# Patient Record
Sex: Male | Born: 1985
Health system: Southern US, Community
[De-identification: ages and names within clinical notes are randomized; demographics above are authoritative.]

## PROBLEM LIST (undated history)

## (undated) DIAGNOSIS — I255 Ischemic cardiomyopathy: Secondary | ICD-10-CM

## (undated) DIAGNOSIS — I2510 Atherosclerotic heart disease of native coronary artery without angina pectoris: Secondary | ICD-10-CM

## (undated) DIAGNOSIS — L732 Hidradenitis suppurativa: Secondary | ICD-10-CM

## (undated) DIAGNOSIS — E785 Hyperlipidemia, unspecified: Secondary | ICD-10-CM

## (undated) DIAGNOSIS — Z9289 Personal history of other medical treatment: Secondary | ICD-10-CM

## (undated) HISTORY — PX: ANKLE SURGERY: SHX546

## (undated) HISTORY — PX: PILONIDAL CYST EXCISION: SHX744

## (undated) HISTORY — PX: CORONARY ANGIOPLASTY: SHX604

## (undated) HISTORY — DX: Hyperlipidemia, unspecified: E78.5

## (undated) HISTORY — DX: Hidradenitis suppurativa: L73.2

## (undated) HISTORY — PX: CARDIAC CATHETERIZATION: SHX172

## (undated) HISTORY — PX: WISDOM TOOTH EXTRACTION: SHX21

## (undated) HISTORY — DX: Ischemic cardiomyopathy: I25.5

## (undated) HISTORY — DX: Atherosclerotic heart disease of native coronary artery without angina pectoris: I25.10

## (undated) HISTORY — DX: Personal history of other medical treatment: Z92.89

---

## 2004-01-26 ENCOUNTER — Emergency Department (HOSPITAL_COMMUNITY): Admission: EM | Admit: 2004-01-26 | Discharge: 2004-01-26 | Payer: Self-pay | Admitting: Emergency Medicine

## 2004-01-28 ENCOUNTER — Inpatient Hospital Stay (HOSPITAL_COMMUNITY): Admission: AD | Admit: 2004-01-28 | Discharge: 2004-02-02 | Payer: Self-pay | Admitting: Specialist

## 2005-12-20 ENCOUNTER — Ambulatory Visit (HOSPITAL_BASED_OUTPATIENT_CLINIC_OR_DEPARTMENT_OTHER): Admission: RE | Admit: 2005-12-20 | Discharge: 2005-12-20 | Payer: Self-pay | Admitting: Surgery

## 2005-12-20 ENCOUNTER — Encounter (INDEPENDENT_AMBULATORY_CARE_PROVIDER_SITE_OTHER): Payer: Self-pay | Admitting: *Deleted

## 2006-02-06 ENCOUNTER — Ambulatory Visit: Payer: Self-pay | Admitting: Family Medicine

## 2006-02-07 ENCOUNTER — Ambulatory Visit: Payer: Self-pay | Admitting: Family Medicine

## 2007-10-09 ENCOUNTER — Encounter (INDEPENDENT_AMBULATORY_CARE_PROVIDER_SITE_OTHER): Payer: Self-pay | Admitting: Surgery

## 2007-10-09 ENCOUNTER — Ambulatory Visit (HOSPITAL_COMMUNITY): Admission: RE | Admit: 2007-10-09 | Discharge: 2007-10-09 | Payer: Self-pay | Admitting: Surgery

## 2008-01-13 ENCOUNTER — Encounter (INDEPENDENT_AMBULATORY_CARE_PROVIDER_SITE_OTHER): Payer: Self-pay | Admitting: Surgery

## 2008-01-13 ENCOUNTER — Ambulatory Visit (HOSPITAL_BASED_OUTPATIENT_CLINIC_OR_DEPARTMENT_OTHER): Admission: RE | Admit: 2008-01-13 | Discharge: 2008-01-13 | Payer: Self-pay | Admitting: Surgery

## 2010-10-03 NOTE — Op Note (Signed)
[name], [name]              ACCOUNT NO.:  [id number]   MEDICAL RECORD NO.:  [id number]          PATIENT TYPE:  AMB   LOCATION:  DSC                          FACILITY:  [facility]   PHYSICIAN:  Wilmon Arms. Corliss Skains, M.D. DATE OF BIRTH:  [birth year]   DATE OF PROCEDURE:  01/13/2008  DATE OF DISCHARGE:                               OPERATIVE REPORT   PREOPERATIVE DIAGNOSIS:  Chronic pilonidal abscess.   POSTOPERATIVE DIAGNOSIS:  Chronic pilonidal abscess.   PROCEDURE PERFORMED:  Pilonidal cystectomy.   SURGEON:  Wilmon Arms. Corliss Skains, MD   ANESTHESIA:  General.   INDICATIONS:  The patient is a 25 year old male who has had chronic  problems with a pilonidal abscess.  He has had 2 previous excisions.  Both these healed up well except for the most inferior portion of the  wound.  This has began draining.  It seems to be tunneling towards a  midline pit closer to the anus.  However, this does not seem to  represent a perirectal abscess.  Instead, this seems to be a tunneling  pilonidal abscess.  We returned to the operating room today to try to  excise all this area.   DESCRIPTION OF PROCEDURE:  The patient was brought to the operating room  and placed in supine position on the operating table.  After an adequate  level of general anesthesia was obtained, the patient was flipped to a  prone position.  The area around the wound was shaved, prepped with  Betadine and draped in sterile fashion.  A time-out was taken to ensure  the proper patient and proper procedure.  I infiltrated the area around  the abscess with 0.25% Marcaine with epinephrine.  I injected local  anesthetic into the draining sinus tract and this local was seen to exit  one of the midline pits.  I was able to pass a probe to this area.  I  then took a #10 scalpel and excised the skin and granulation tissue  around the sinus tract as well as the midline pits.  This created an  elliptical wound about 2-1/2 inches long.  Cautery  was then used to  excise all of the chronic granulation tissue back to healthy appearing  fat.  This continued down all the way to the sacral fascia.  Cautery was  used for hemostasis.  We then thoroughly irrigated.  No further  granulation tissue was noted.  The wound was packed with 4x4 gauze.  An  ABD was applied.  The patient was then extubated and brought to recovery  room in stable condition.  All sponge, instrument, and needle counts  were correct.      Wilmon Arms. Tsuei, M.D.  Electronically Signed     MKT/MEDQ  D:  01/13/2008  T:  01/13/2008  Job:  [id number]

## 2010-10-03 NOTE — Op Note (Signed)
[name], [name]              ACCOUNT NO.:  [id number]   MEDICAL RECORD NO.:  [id number]          PATIENT TYPE:  AMB   LOCATION:  DAY                          FACILITY:  [facility]   PHYSICIAN:  Wilmon Arms. Corliss Skains, M.D. DATE OF BIRTH:  1985/11/28   DATE OF PROCEDURE:  10/09/2007  DATE OF DISCHARGE:                               OPERATIVE REPORT   PREOPERATIVE DIAGNOSIS:  Chronic pilonidal abscess.   POSTOPERATIVE DIAGNOSIS:  Chronic pilonidal abscess.   PROCEDURE PERFORMED:  Excision of chronic pilonidal abscess.   SURGEON:  Tsuei   ANESTHESIA:  General.   INDICATIONS:  The patient is a 25 year old male who has previously had a  pilonidal abscess excised.  The patient has subsequently developed a  chronic wound which occasionally becomes inflamed as well as opens and  spontaneously drains.  This has been kept under control with  antibiotics, but the patient now presents for excision of this entire  area.  Our preoperative plan is to leave him with an open wound to heal  by secondary intention.  The patient understands this plan.   DESCRIPTION OF PROCEDURE:  The patient is brought to the operating room,  placed in a supine position on the operating room stretcher.  After an  adequate level of general anesthesia was obtained, the patient was  flipped to a prone position on the bed with appropriate padding.  The  area around the lower back and upper buttocks was prepped with Betadine  and draped in sterile fashion.  We infiltrated the area around the  chronic wound with 0.25% Marcaine with epinephrine.  There was a little  bit of purulence seen coming from the wound.  I probed the drainage  tract with hemostat.  This seemed to track medially and inferiorly.  We  excised all the old scar tissue.  We took our excision around all of the  chronic inflammation down to the fascia.  There was an area of  granulation tissue seen tracking medially and inferiorly.  This was also  excised.   All of the surrounding tissue seemed to be healthy and clean  with no sign of chronic granulation or infection.  The wound was then  thoroughly irrigated.  The wound was packed with a 4 inch x 4 inch piece  of gauze soaked in saline.  A dry dressing was applied.  The patient was  then extubated and brought to recovery in stable condition.  All sponge,  instrument, and needle counts were correct.      Wilmon Arms. Tsuei, M.D.  Electronically Signed     MKT/MEDQ  D:  10/09/2007  T:  10/09/2007  Job:  [id number]

## 2010-10-06 NOTE — Op Note (Signed)
NAMEERIE, RADU              ACCOUNT NO.:  [id number]   MEDICAL RECORD NO.:  [id number]           PATIENT TYPE:   LOCATION:                               FACILITY:  [facility]   PHYSICIAN:  Wilmon Arms. Tsuei, M.D.      DATE OF BIRTH:   DATE OF PROCEDURE:  12/20/2005  DATE OF DISCHARGE:                               OPERATIVE REPORT   PREOPERATIVE DIAGNOSIS:  Chronic pilonidal cyst.   POSTOPERATIVE DIAGNOSIS:  Chronic pilonidal cyst.   PROCEDURE PERFORMED:  Pilonidal cystectomy.   SURGEON:  Wilmon Arms. Tsuei, M.D.   ANESTHESIA:  General.   INDICATIONS:  The patient is a 25 year old male who has had a 6- to 7-  year history of an inflamed pilonidal cyst.  This occasionally becomes  inflamed and infected, but has never drained.  On examination, he has a  2-cm palpable subcutaneous mass at the area of the coccyx.  There are  several midline pits and some scarring.   DESCRIPTION OF PROCEDURE:  The patient was brought to the operating room  and placed in the supine position on his stretcher.  After an adequate  level of general anesthesia was obtained, the patient was flipped to a  prone position on the operating room table.  Appropriate padding was  placed.  The area around the pilonidal cyst was shaved, prepped with  Betadine and draped in sterile fashion.  A time-out was taken to assure  the proper patient and proper procedure.  A vertical incision was made  just left of the midline over the palpable mass.  Dissection was carried  down through the dermis into the subcutaneous tissues.  Cautery was used  to excise a wide area of scar tissue down to normal appearing fat.  The  granulation tissue seemed to track inferiorly towards the midline pits.  We continued dissecting this free with cautery.  Once all of the  granulation tissue was removed, the wound was then thoroughly irrigated.  It was closed with a deep layer of 3-0 Vicryl and 2-0 nylon sutures in  the skin.  The patient was  then extubated and brought to the recovery  room in stable condition.  All sponge, instrument and needle counts were  correct.      Wilmon Arms. Tsuei, M.D.  Electronically Signed     MKT/MEDQ  D:  05/20/2006  T:  05/21/2006  Job:  [id number]

## 2010-10-06 NOTE — Discharge Summary (Signed)
[name], [name]              ACCOUNT NO.:  [id number]   MEDICAL RECORD NO.:  [id number]          PATIENT TYPE:  INP   LOCATION:  5006                         FACILITY:  [facility]   PHYSICIAN:  Kerrin Champagne, M.D.   DATE OF BIRTH:  04/12/1986   DATE OF ADMISSION:  01/28/2004  DATE OF DISCHARGE:  02/02/2004                                 DISCHARGE SUMMARY   ADMISSION DIAGNOSES:  1.  Left displaced comminuted Taylor neck fracture.  2.  Right ankle pain, questionable fracture.  3.  Attention deficit disorder.   DISCHARGE DIAGNOSES:  1.  Left closed Taylor neck fracture, Hawkins type II.  2.  Fracture of posterior malleolus, nondisplaced, right ankle.  3.  Attention deficit disorder.   PROCEDURE:  On 01/29/2004, the patient underwent open reduction/internal  fixation of left closed Taylor neck fracture as well as stress x-rays of the  left ankle performed under general anesthesia by Dr. Leonides Grills and Dr.  Vira Browns.   CONSULTATIONS:  Dr. Lestine Box.   BRIEF HISTORY:  The patient is a 25 year old male who fell approximately two  days prior to admission from a retaining wall as it gave way from  approximately 20 feet.  He was evaluated in the emergency room at [facility] on that day and noted to have an isolated left closed Taylor neck  fracture which was treated with a splint placement.  He was seen by Dr.  Otelia Sergeant in his office, at which time, the patient was noted to have  displacement and severe comminution of the left Taylor neck fracture which  would require surgical intervention.  From the office the patient was  admitted to the hospital to undergo CT scan followed by open  reduction/internal fixation of the fracture.   BRIEF HOSPITAL COURSE:  Upon admission, the patient did undergo the CT scan  with reformatting.  He was noted to have severe comminution of the left  Taylor neck fracture.  He underwent the procedure as stated above without  complications.  He  continued to have pain of the right ankle and foot area.  Due to this continued pain, his activity level was held until a CT scan was  performed of the right foot.  The right foot CT scan did show a posterior  malleolus fracture on the right.  At that time, he was placed into a CAM  walker for the right ankle.  As for activity, he was allowed bed-to-chair  transfers only.  This was done by allowing non-weightbearing on the  operative left lower extremity and touch down weightbearing only on the  right lower extremity while utilizing a CAM walker.  Therefore a wheelchair  had to be available to him at home and this was arranged prior to his  transfer to home.  The patient was placed on aspirin for DVT prophylaxis.  He was instructed in ice and elevation of his foot and did so during the  hospital stay.  Postoperatively, he did require some IV narcotic analgesics,  but eventually was weaned to oral analgesics without difficulty.  He had  good  capillary refill and the sensation of his toes.  His cast to the left  lower extremity did fit well.  The patient was afebrile and vital signs were  stable.  On 02/02/2004, after he had proven to be independent with  transfers, he was discharged to his home.   PERTINENT LABORATORY VALUES:  On admission, CBC was within normal limits.  Chemistry studies on admission showed sodium 132, potassium 3.2, calcium  8.3.   PLAN:  The patient was discharged to his home.  Dr. Lestine Box did provide  instructions for the patient as he will be taking over the patient's care.  The patient will follow up with Dr. Lestine Box in his office in approximately  one week and has been given a number to call to arrange the appointment.  He  is instructed to elevate his left foot above his heart and have active range  of motion of his toes at all times on both feet.  He is instructed to keep  his splint to the left lower extremity dry and clean at all times.  For  transfers, he will  be allowed non-weightbearing on the left lower extremity  and touch down weightbearing on the right lower extremity while wearing a  sprain walker.  He will not be allowed ambulatory function at this time.  Prescriptions at discharge include Mepergan, Fortis, and  Robaxin.  He will  continue on a regular diet.   CONDITION ON DISCHARGE:  Stable.   All questions were encouraged and answered by Dr. Lestine Box.       SMV/MEDQ  D:  03/16/2004  T:  03/17/2004  Job:  [id number]

## 2010-10-06 NOTE — Assessment & Plan Note (Signed)
[facility]                          [place] OFFICE NOTE   NAME:[name], [name] SEDIVY                     MRN:          [id number]  DATE:02/06/2006                            DOB:          1986/03/19    REASON FOR VISIT:  [name] [name] is a 25 year old male who reports that for  several years he has noted that he had a lymph node on the right side of his  neck which has not grown in size.  Over the last several months, he has  noticed similar lymph nodes on the left side of the neck, left clavicle and  inguinal area.  He denies any fevers, chills, weight loss, weight gain.  Acne by his dermatologist.  He has been on multiple medications including  oral.  No family history of leukemia or lymphoma.   The patient does have a history of skin eruption that according to the  patient has been treated as acne.  He has been on multiple oral agents and  tells me that his next option is Accutane.  The eruptions occur on his  chest, back and groin.  He reports that they are mildly pruritic.  He has  also noticed other eruptions on his arms briefly which initially started as  pruritis.  The patient is currently followed by dermatologist but has not  seen an allergist.   PAST MEDICAL HISTORY:  Acne.   MEDICATIONS:  None.   ALLERGIES:  PENICILLIN.   PAST SURGICAL HISTORY:  1. Left ankle surgery for internal fixation.  2. Pilonidal cystectomy.   FAMILY HISTORY:  Father and mother alive and well.  He has one brother who  is also alive and well.   SOCIAL HISTORY:  The patient is a Consulting civil engineer at [employer].  He is single.  Smokes  socially as well as drinks alcohol socially.   REVIEW OF SYSTEMS:  As per HPI.  Otherwise unremarkable.   OBJECTIVE:  VITAL SIGNS:  Weight 155.4, temperature 98.4, pulse 60, blood  pressure 100/66.  GENERAL:  Pleasant male in no acute distress.  Answers questions  appropriately.  HEENT:  Unremarkable.  NECK:  Supple.  There are two  shotty lymph nodes, one in the right posterior  cervical area as well as a smaller one on the left posterior cervical area.  They are no bigger than 6-7 mm in diameter.  There was a smaller 3-4 mm,  easily movable lymph node at the left supraclavicular area, i.e., midline.  No axillary lymph nodes were appreciated.  ABDOMEN:  Soft, nondistended.  No palpable masses.  No hepatosplenomegaly.  Inguinal area examination significant for bilateral shotty lymph nodes in a  linear pattern, nontender.  SKIN:  Significant for diffuse follicular erythematous eruptions on the  chest, less on the abdomen but increased in the pubic area.  The upper  extremities significant for sporadic follicular eruptions.  No pustules  noted.   IMPRESSION:  A 25 year old male presenting with prominence of lymph nodes  which may be reactive lymph nodes secondary to chronic inflammatory skin  condition given the location of the eruptions.  Nonetheless,  further  evaluation is warranted.   PLAN:  1. Will obtain a CBC with differential to rule out a significantly      elevated white count.  2. Will refer the patient to an allergist given the chronic skin eruptions      to rule out allergic etiology.  3. Further recommendations after review of the CBC.                                   Leanne Chang, M.D.   LA/MedQ  DD:  02/07/2006  DT:  02/08/2006  Job #:  [id number]

## 2010-10-06 NOTE — Op Note (Signed)
NAMEELIER, ZELLARS              ACCOUNT NO.:  [id number]   MEDICAL RECORD NO.:  [id number]          PATIENT TYPE:  AMB   LOCATION:  DSC                          FACILITY:  [facility]   PHYSICIAN:  Wilmon Arms. Corliss Skains, M.D. DATE OF BIRTH:  1985-09-02   DATE OF PROCEDURE:  12/20/2005  DATE OF DISCHARGE:                                 OPERATIVE REPORT   PREOPERATIVE DIAGNOSIS:  Pilonidal cyst.   POSTOPERATIVE DIAGNOSIS:  Pilonidal cyst.   PROCEDURE PERFORMED:  Pilonidal cystectomy.   SURGEON:  Wilmon Arms. Tsuei, M.D.   ANESTHESIA:  General endotracheal.   INDICATIONS:  The patient is a 25 year old male who presents with a 6- to 7-  year history of intermittent problems with a pilonidal cyst.  He has had  several instances where it has become fairly large and inflamed.  It has  never drained any purulent material.  He was evaluated this summer and was  noted to have a large noninflamed cyst.  The cyst did appear to be more  superior in location than the more common pilonidal cyst.  He presents today  for elective excision.   DESCRIPTION OF PROCEDURE:  The patient was brought to the operating room on  a stretcher.  After an adequate level of general endotracheal anesthesia was  obtained, the patient was turned to a prone position on chest rolls with  appropriate padding.  His gluteal cleft and lower back was shaved, prepped  with Betadine and draped in sterile fashion.  A time-out was taken to assure  the proper patient and proper procedure.  The cyst was easily palpable to  the left of midline approximately 2-3 cm above the coccyx.  A vertical  incision was made over this cyst after infiltrating with 0.25% Marcaine.  We  dissected down to a cystic-like structure that was filled with a slimy  material.  Cautery was used to dissect the entire cyst wall away from the  surrounding tissues.  The surrounding tissues were normal and noninflamed in  appearance.  A small probe was used to  explore for any tracks.  The patient  was noted to have some small midline pits, but none of these appeared to be  patent.  They probed the track inferiorly towards the midline pits, but  there was no external skin opening identified.  Saline was squirted into  this track under pressure, and no external drainage was noted.  The wall of  the small tract was thoroughly cauterized and ablated.  The remaining cavity  was then thoroughly irrigated, and hemostasis was obtained with cautery.  The wound was closed with a deep layer of 3-0 Vicryl, tacking the  subcutaneous tissue down to the posterior surface of the muscle.  A 4-0  Monocryl was used to close the skin in subcuticular fashion.  Steri-Strips  and clean dressings were applied.  The patient was then extubated and  brought to recovery in stable condition.  All sponge, instrument and needle  counts correct.     Wilmon Arms. Tsuei, M.D.  Electronically Signed    MKT/MEDQ  D:  12/20/2005  T:  12/20/2005  Job:  [id number]

## 2010-10-06 NOTE — Op Note (Signed)
NAME:  [name], [name]                        ACCOUNT NO.:  [id number]   MEDICAL RECORD NO.:  [id number]                   PATIENT TYPE:  INP   LOCATION:  5006                                 FACILITY:  [facility]   PHYSICIAN:  Leonides Grills, M.D.                  DATE OF BIRTH:  07-19-1985   DATE OF PROCEDURE:  01/29/2004  DATE OF DISCHARGE:                                 OPERATIVE REPORT   PREOPERATIVE DIAGNOSIS:  Left closed talar neck fracture, Hawkins type II.   POSTOPERATIVE DIAGNOSIS:  Left closed talar neck fracture, Hawkins type II.   OPERATION:  1.  Open reduction internal fixation left closed talar neck fracture.  2.  Stress x-rays left ankle.   ANESTHESIA:  General endotracheal tube.   SURGEON:  Leonides Grills, M.D.   ASSISTANT:  Vira Browns, M.D.   ESTIMATED BLOOD LOSS:  Minimal.   TOURNIQUET TIME:  1 hour and 40 minutes.   COMPLICATIONS:  None.   DISPOSITION:  Stable to the PAR.   INDICATION:  This is a 25 year old male who approximately 2 days ago fell  off a retaining wall after it gave way and fell approximately 20 feet.  He  had an isolated left closed talar neck fracture that was initially evaluated  in the ER.  He was then sent home in a splint.  Due to his grandmother  knowing Dr. Otelia Sergeant, he then reported to our acute care clinic on Friday  afternoon, it was where Dr. Otelia Sergeant diagnosed him with a talar neck fracture  and worked him up appropriately and admitted him to the hospital for further  evaluation and treatment.  After he admitted to hospital, a CT scan was  obtained and showed a talar neck fracture with an extension into the body  with a free lateral process component.  After discussing the case with  myself, we then planned to do open reduction internal fixation the very next  morning, especially since his skin was of excellent quality and there was  wrinkles in his skin from keeping him elevated for at least 24 hours.  He  was consented for the  above procedure, all of which included infection,  nerve vessel injury, nonunion, malunion, __________, heart failure,  stiffness, arthritis, avascular necrosis and collapse with arthritis in both  the ankle and subtalar joint and possible future surgery were all explained,  questions were encouraged and answered.   OPERATION:  The patient was brought to the operating room, placed in the  supine position.  After adequate general endotracheal tube anesthesia was  administered as well as Ancef 1 gm IVPB, a bump was placed under the left  ipsilateral hip, internally rotating his knee straight up, and the left  lower extremity was then prepped and draped in a sterile manner over a  proximally placed thigh tourniquet.  Limb was gravity exsanguinated and  tourniquet was elevated to 290 mmHg.  A longitudinal incision just dorsal to  the posterior tibial tendon was then made, dissection was carried down  through skin, hemostasis was obtained.  A large soft tissue envelope was  obtained and the saphenous vein was identified and protected throughout the  case.  Once this was elevated and the fracture site was encountered, the  hematoma and residual comminuted fragments were removed, the piece was then  anatomically positioned and provisionally fixed with a 1.25 K-wire.  We then  made a longitudinal incision over the dorsal lateral aspect of the talus,  dissection was carried down through skin, hemostasis was obtained.  The  extensor digitorum brevis was then released from its origin and the sinus  tarsi was then debrided of hematoma and mild comminution.  This was the  larger of the two fragments.  The plantar soft tissue component to the  fragment was preserved.  The piece was then internally rotated, exposing the  coronal fracture line, and the hematoma as well as comminuted fragments were  removed not only from the fracture site but also from the subtalar joint,  ankle joint area.  The piece was  then rotated back anatomically, positioned  anatomically, compressed with a two-point Weber reduction clamp from both  medial and laterally and then provisionally affixed with the 1.25 K-wire.  This was then verified under C-arm guidance in the AP, lateral and Canale  view.  We then placed 2.7 mm fully threaded cortical lag screws, three  lateral and one medial, that held the fracture anatomically.  K-wires were  then removed.  Stress x-rays were obtained and showed that there was no  gross motion and the pieces were in the anatomic position as well as  fixation was in the proper position as well.  The joints and wounds were  copiously irrigated with normal saline.  Tourniquet was inflated, hemostasis  was obtained.  __________ we obtained a Canale view as well and showed that  the neck was anatomically reduced.  We also obtained AP and mortise views of  the ankle which showed it was in excellent position as well.  Once the ankle  was copiously irrigated with normal saline solution, extensor digitorum  brevis and a portion of the lateral capsule and ligaments were repaired with  2-0 Vicryl suture.  The subcu was closed with a 3-0 Vicryl suture over all  wounds and skin was closed with 4-0 nylon overall wounds.  A sterile  dressing was applied.  Modified Jones dressing was applied.  The patient was  stable to the PAR.                                               Leonides Grills, M.D.    PB/MEDQ  D:  01/29/2004  T:  01/30/2004  Job:  [id number]

## 2013-06-14 ENCOUNTER — Encounter (HOSPITAL_BASED_OUTPATIENT_CLINIC_OR_DEPARTMENT_OTHER): Payer: Self-pay | Admitting: Emergency Medicine

## 2013-06-14 ENCOUNTER — Ambulatory Visit (HOSPITAL_COMMUNITY): Admit: 2013-06-14 | Payer: Self-pay | Admitting: Cardiovascular Disease

## 2013-06-14 ENCOUNTER — Encounter (HOSPITAL_COMMUNITY)
Admission: EM | Disposition: A | Discharge status: Home / Self Care | Payer: BC Managed Care – PPO | Source: Home / Self Care | Attending: Cardiology

## 2013-06-14 ENCOUNTER — Emergency Department (HOSPITAL_BASED_OUTPATIENT_CLINIC_OR_DEPARTMENT_OTHER): Payer: BC Managed Care – PPO

## 2013-06-14 ENCOUNTER — Inpatient Hospital Stay (HOSPITAL_BASED_OUTPATIENT_CLINIC_OR_DEPARTMENT_OTHER)
Admission: EM | Admit: 2013-06-14 | Discharge: 2013-06-17 | DRG: 247 | Disposition: A | Discharge status: Home / Self Care | Payer: BC Managed Care – PPO | Attending: Cardiology | Admitting: Cardiology

## 2013-06-14 ENCOUNTER — Other Ambulatory Visit: Payer: Self-pay

## 2013-06-14 DIAGNOSIS — I2102 ST elevation (STEMI) myocardial infarction involving left anterior descending coronary artery: Secondary | ICD-10-CM

## 2013-06-14 DIAGNOSIS — R778 Other specified abnormalities of plasma proteins: Secondary | ICD-10-CM

## 2013-06-14 DIAGNOSIS — R079 Chest pain, unspecified: Secondary | ICD-10-CM

## 2013-06-14 DIAGNOSIS — Z880 Allergy status to penicillin: Secondary | ICD-10-CM

## 2013-06-14 DIAGNOSIS — I2510 Atherosclerotic heart disease of native coronary artery without angina pectoris: Secondary | ICD-10-CM

## 2013-06-14 DIAGNOSIS — I2109 ST elevation (STEMI) myocardial infarction involving other coronary artery of anterior wall: Principal | ICD-10-CM | POA: Diagnosis present

## 2013-06-14 DIAGNOSIS — R799 Abnormal finding of blood chemistry, unspecified: Secondary | ICD-10-CM

## 2013-06-14 DIAGNOSIS — I498 Other specified cardiac arrhythmias: Secondary | ICD-10-CM | POA: Diagnosis present

## 2013-06-14 DIAGNOSIS — I214 Non-ST elevation (NSTEMI) myocardial infarction: Secondary | ICD-10-CM | POA: Diagnosis present

## 2013-06-14 DIAGNOSIS — T43625A Adverse effect of amphetamines, initial encounter: Secondary | ICD-10-CM

## 2013-06-14 DIAGNOSIS — Z872 Personal history of diseases of the skin and subcutaneous tissue: Secondary | ICD-10-CM

## 2013-06-14 DIAGNOSIS — R7989 Other specified abnormal findings of blood chemistry: Secondary | ICD-10-CM

## 2013-06-14 DIAGNOSIS — D72829 Elevated white blood cell count, unspecified: Secondary | ICD-10-CM | POA: Diagnosis present

## 2013-06-14 DIAGNOSIS — I519 Heart disease, unspecified: Secondary | ICD-10-CM

## 2013-06-14 HISTORY — PX: PERCUTANEOUS CORONARY STENT INTERVENTION (PCI-S): SHX5485

## 2013-06-14 HISTORY — PX: LEFT HEART CATHETERIZATION WITH CORONARY ANGIOGRAM: SHX5451

## 2013-06-14 LAB — MRSA PCR SCREENING: MRSA BY PCR: NEGATIVE

## 2013-06-14 LAB — CBC
HEMATOCRIT: 39.8 % (ref 39.0–52.0)
Hemoglobin: 13.9 g/dL (ref 13.0–17.0)
MCH: 29.8 pg (ref 26.0–34.0)
MCHC: 34.9 g/dL (ref 30.0–36.0)
MCV: 85.4 fL (ref 78.0–100.0)
Platelets: 277 10*3/uL (ref 150–400)
RBC: 4.66 MIL/uL (ref 4.22–5.81)
RDW: 13.7 % (ref 11.5–15.5)
WBC: 16.3 10*3/uL — ABNORMAL HIGH (ref 4.0–10.5)

## 2013-06-14 LAB — CBC WITH DIFFERENTIAL/PLATELET
BASOS ABS: 0 10*3/uL (ref 0.0–0.1)
BASOS PCT: 0 % (ref 0–1)
EOS ABS: 0.1 10*3/uL (ref 0.0–0.7)
EOS PCT: 1 % (ref 0–5)
HEMATOCRIT: 44.9 % (ref 39.0–52.0)
Hemoglobin: 15.6 g/dL (ref 13.0–17.0)
LYMPHS PCT: 14 % (ref 12–46)
Lymphs Abs: 2.7 10*3/uL (ref 0.7–4.0)
MCH: 29.4 pg (ref 26.0–34.0)
MCHC: 34.7 g/dL (ref 30.0–36.0)
MCV: 84.7 fL (ref 78.0–100.0)
MONO ABS: 1.5 10*3/uL — AB (ref 0.1–1.0)
MONOS PCT: 8 % (ref 3–12)
NEUTROS ABS: 15.6 10*3/uL — AB (ref 1.7–7.7)
NEUTROS PCT: 78 % — AB (ref 43–77)
Platelets: 274 10*3/uL (ref 150–400)
RBC: 5.3 MIL/uL (ref 4.22–5.81)
RDW: 13.2 % (ref 11.5–15.5)
WBC: 19.9 10*3/uL — ABNORMAL HIGH (ref 4.0–10.5)

## 2013-06-14 LAB — COMPREHENSIVE METABOLIC PANEL
ALT: 43 U/L (ref 0–53)
AST: 46 U/L — AB (ref 0–37)
Albumin: 5.2 g/dL (ref 3.5–5.2)
Alkaline Phosphatase: 61 U/L (ref 39–117)
BUN: 12 mg/dL (ref 6–23)
CO2: 24 meq/L (ref 19–32)
CREATININE: 1 mg/dL (ref 0.50–1.35)
Calcium: 10.1 mg/dL (ref 8.4–10.5)
Chloride: 95 mEq/L — ABNORMAL LOW (ref 96–112)
GFR calc Af Amer: >90 mL/min (ref >90)
GFR calc non Af Amer: >90 mL/min (ref >90)
Glucose, Bld: 121 mg/dL — ABNORMAL HIGH (ref 70–99)
POTASSIUM: 3.6 meq/L — AB (ref 3.7–5.3)
Sodium: 138 mEq/L (ref 137–147)
Total Bilirubin: 0.7 mg/dL (ref 0.3–1.2)
Total Protein: 8.2 g/dL (ref 6.0–8.3)

## 2013-06-14 LAB — TROPONIN I
Troponin I: 0.35 ng/mL (ref <0.30)
Troponin I: 3.06 ng/mL (ref <0.30)
Troponin I: >20 ng/mL (ref <0.30)

## 2013-06-14 LAB — RAPID URINE DRUG SCREEN, HOSP PERFORMED
Amphetamines: POSITIVE — AB
BARBITURATES: NOT DETECTED
BENZODIAZEPINES: NOT DETECTED
Cocaine: NOT DETECTED
Opiates: POSITIVE — AB
Tetrahydrocannabinol: NOT DETECTED

## 2013-06-14 LAB — LACTIC ACID, PLASMA: Lactic Acid, Venous: 1.1 mmol/L (ref 0.5–2.2)

## 2013-06-14 LAB — D-DIMER, QUANTITATIVE: D-Dimer, Quant: <0.27 ug/mL-FEU (ref 0.00–0.48)

## 2013-06-14 LAB — PRO B NATRIURETIC PEPTIDE: Pro B Natriuretic peptide (BNP): 66 pg/mL (ref 0–125)

## 2013-06-14 SURGERY — LEFT HEART CATHETERIZATION WITH CORONARY ANGIOGRAM
Anesthesia: LOCAL

## 2013-06-14 MED ORDER — HEPARIN BOLUS VIA INFUSION
4000.0000 [IU] | Freq: Once | INTRAVENOUS | Status: AC
  Administered 2013-06-14: 4000 [IU] via INTRAVENOUS
  Filled 2013-06-14: qty 4000

## 2013-06-14 MED ORDER — LIDOCAINE HCL (PF) 1 % IJ SOLN
INTRAMUSCULAR | Status: AC
  Filled 2013-06-14: qty 30

## 2013-06-14 MED ORDER — NITROGLYCERIN IN D5W 200-5 MCG/ML-% IV SOLN
INTRAVENOUS | Status: AC
  Administered 2013-06-14: 5 ug/min via INTRAVENOUS
  Filled 2013-06-14: qty 250

## 2013-06-14 MED ORDER — TIROFIBAN HCL IV 5 MG/100ML
0.1500 ug/kg/min | INTRAVENOUS | Status: AC
Start: 2013-06-14 — End: 2013-06-15
  Administered 2013-06-14 (×2): 0.15 ug/kg/min via INTRAVENOUS
  Filled 2013-06-14 (×4): qty 100

## 2013-06-14 MED ORDER — ASPIRIN 81 MG PO CHEW
CHEWABLE_TABLET | ORAL | Status: AC
  Filled 2013-06-14: qty 4

## 2013-06-14 MED ORDER — HEPARIN (PORCINE) IN NACL 2-0.9 UNIT/ML-% IJ SOLN
INTRAMUSCULAR | Status: AC
  Filled 2013-06-14: qty 1500

## 2013-06-14 MED ORDER — HEPARIN (PORCINE) IN NACL 100-0.45 UNIT/ML-% IJ SOLN
1000.0000 [IU]/h | INTRAMUSCULAR | Status: DC
  Administered 2013-06-14: 1000 [IU]/h via INTRAVENOUS
  Filled 2013-06-14: qty 250

## 2013-06-14 MED ORDER — METOPROLOL TARTRATE 1 MG/ML IV SOLN
5.0000 mg | Freq: Once | INTRAVENOUS | Status: AC
  Administered 2013-06-14: 5 mg via INTRAVENOUS

## 2013-06-14 MED ORDER — ASPIRIN 81 MG PO CHEW: 324.0000 mg | CHEWABLE_TABLET | Freq: Once | ORAL | Status: DC

## 2013-06-14 MED ORDER — ONDANSETRON HCL 4 MG/2ML IJ SOLN
4.0000 mg | Freq: Four times a day (QID) | INTRAMUSCULAR | Status: DC | PRN
  Administered 2013-06-14: 4 mg via INTRAVENOUS
  Filled 2013-06-14: qty 2

## 2013-06-14 MED ORDER — FENTANYL CITRATE 0.05 MG/ML IJ SOLN
INTRAMUSCULAR | Status: AC
  Filled 2013-06-14: qty 2

## 2013-06-14 MED ORDER — BIVALIRUDIN 250 MG IV SOLR
INTRAVENOUS | Status: AC
  Filled 2013-06-14: qty 250

## 2013-06-14 MED ORDER — PRASUGREL HCL 10 MG PO TABS
10.0000 mg | ORAL_TABLET | Freq: Every day | ORAL | Status: DC
  Administered 2013-06-15 – 2013-06-17 (×3): 10 mg via ORAL
  Filled 2013-06-14 (×3): qty 1

## 2013-06-14 MED ORDER — METOPROLOL TARTRATE 1 MG/ML IV SOLN
INTRAVENOUS | Status: AC
  Filled 2013-06-14: qty 5

## 2013-06-14 MED ORDER — METOPROLOL TARTRATE 1 MG/ML IV SOLN
2.5000 mg | Freq: Once | INTRAVENOUS | Status: AC
  Administered 2013-06-14: 2.5 mg via INTRAVENOUS

## 2013-06-14 MED ORDER — ASPIRIN EC 81 MG PO TBEC
81.0000 mg | DELAYED_RELEASE_TABLET | Freq: Every day | ORAL | Status: DC
  Administered 2013-06-15 – 2013-06-17 (×3): 81 mg via ORAL
  Filled 2013-06-14 (×3): qty 1

## 2013-06-14 MED ORDER — TIROFIBAN HCL IV 5 MG/100ML
INTRAVENOUS | Status: AC
  Filled 2013-06-14: qty 100

## 2013-06-14 MED ORDER — MORPHINE SULFATE 2 MG/ML IJ SOLN
INTRAMUSCULAR | Status: AC
  Administered 2013-06-14: 2 mg via INTRAVENOUS
  Filled 2013-06-14: qty 1

## 2013-06-14 MED ORDER — ACETAMINOPHEN 325 MG PO TABS: 650.0000 mg | ORAL_TABLET | ORAL | Status: DC | PRN

## 2013-06-14 MED ORDER — TIROFIBAN HCL IV 12.5 MG/250 ML
INTRAVENOUS | Status: AC
  Filled 2013-06-14: qty 250

## 2013-06-14 MED ORDER — PRASUGREL HCL 10 MG PO TABS
ORAL_TABLET | ORAL | Status: AC
Start: 2013-06-14 — End: 2013-06-14
  Filled 2013-06-14: qty 6

## 2013-06-14 MED ORDER — HEPARIN SODIUM (PORCINE) 1000 UNIT/ML IJ SOLN
INTRAMUSCULAR | Status: AC
  Filled 2013-06-14: qty 1

## 2013-06-14 MED ORDER — MORPHINE SULFATE 2 MG/ML IJ SOLN
2.0000 mg | Freq: Once | INTRAMUSCULAR | Status: AC
  Administered 2013-06-14: 2 mg via INTRAVENOUS

## 2013-06-14 MED ORDER — VERAPAMIL HCL 2.5 MG/ML IV SOLN
INTRAVENOUS | Status: AC
  Filled 2013-06-14: qty 2

## 2013-06-14 MED ORDER — NITROGLYCERIN 0.2 MG/ML ON CALL CATH LAB
INTRAVENOUS | Status: AC
  Filled 2013-06-14: qty 1

## 2013-06-14 MED ORDER — NITROGLYCERIN IN D5W 200-5 MCG/ML-% IV SOLN
2.0000 ug/min | INTRAVENOUS | Status: AC
  Administered 2013-06-14: 5 ug/min via INTRAVENOUS

## 2013-06-14 MED ORDER — METOPROLOL TARTRATE 25 MG PO TABS
25.0000 mg | ORAL_TABLET | Freq: Two times a day (BID) | ORAL | Status: DC
  Administered 2013-06-14 – 2013-06-16 (×5): 25 mg via ORAL
  Filled 2013-06-14 (×8): qty 1

## 2013-06-14 MED ORDER — MORPHINE SULFATE 4 MG/ML IJ SOLN
4.0000 mg | INTRAMUSCULAR | Status: DC | PRN
  Administered 2013-06-14 – 2013-06-15 (×10): 4 mg via INTRAVENOUS
  Filled 2013-06-14 (×10): qty 1

## 2013-06-14 MED ORDER — SODIUM CHLORIDE 0.9 % IV SOLN
INTRAVENOUS | Status: AC
  Administered 2013-06-14: 12:00:00 via INTRAVENOUS

## 2013-06-14 MED ORDER — MORPHINE SULFATE 2 MG/ML IJ SOLN: 2.0000 mg | INTRAMUSCULAR | Status: DC | PRN

## 2013-06-14 MED ORDER — NITROGLYCERIN 0.4 MG SL SUBL
0.4000 mg | SUBLINGUAL_TABLET | SUBLINGUAL | Status: DC | PRN
  Administered 2013-06-14: 0.4 mg via SUBLINGUAL
  Filled 2013-06-14 (×2): qty 25

## 2013-06-14 MED ORDER — ATORVASTATIN CALCIUM 40 MG PO TABS
40.0000 mg | ORAL_TABLET | Freq: Every day | ORAL | Status: DC
  Administered 2013-06-14 – 2013-06-16 (×3): 40 mg via ORAL
  Filled 2013-06-14 (×4): qty 1

## 2013-06-14 MED ORDER — MIDAZOLAM HCL 2 MG/2ML IJ SOLN
INTRAMUSCULAR | Status: AC
  Filled 2013-06-14: qty 2

## 2013-06-14 NOTE — Progress Notes (Signed)
CRITICAL VALUE ALERT  Critical value received:  Troponin 3.06  Date of notification:  06/14/2013  Time of notification:  0615  Critical value read back:yes  Nurse who received alert:  Anselm LisMelvin kuffour  MD notified (1st page):  Anne NgKohan Luke  Time of first page:  0615  MD notified (2nd page):  Time of second page:  Responding MD:  Anne NgKohan luke  Time MD responded: [phone]

## 2013-06-14 NOTE — ED Notes (Signed)
Pt reports taking 650 mg ASA at home prior to arrival

## 2013-06-14 NOTE — Progress Notes (Signed)
  Echocardiogram 2D Echocardiogram has been performed.  Kimya Mccahill FRANCES 06/14/2013, 8:01 AM

## 2013-06-14 NOTE — ED Notes (Signed)
Pt reports CP radiating down both bilateral arms x 1 hour.  Reports that he has been cold.  Pt reports SOB.  Denies N/V.  Increased pain with deep inspiration.  Pt was playing basketball when sensation started.

## 2013-06-14 NOTE — Progress Notes (Signed)
ANTICOAGULATION CONSULT NOTE - Initial Consult  Pharmacy Consult for Heparin Indication: chest pain/ACS  Allergies  Allergen Reactions  . Penicillins     Patient Measurements: Height: 5\' 7"  (170.2 cm) Weight: 178 lb 5.6 oz (80.9 kg) IBW/kg (Calculated) : 66.1  Vital Signs: Temp: 98.6 F (37 C) (01/25 0400) Temp src: Oral (01/25 0400) BP: 143/82 mmHg (01/25 0415) Pulse Rate: 76 (01/25 0310)  Labs:  Recent Labs  06/14/13 0129  HGB 15.6  HCT 44.9  PLT 274  CREATININE 1.00  TROPONINI 0.35*    Estimated Creatinine Clearance: 111 ml/min (by C-G formula based on Cr of 1).   Medical History: History reviewed. No pertinent past medical history.  Medications:  No prescriptions prior to admission    Assessment: 28 yo male with chest pain elevated cardiac enzymes for heparin  Goal of Therapy:  Heparin level 0.3-0.7 units/ml Monitor platelets by anticoagulation protocol: Yes   Plan:  Heparin 4000 units IV bolus, then 1000 units/hr Check heparin level in 6 hours.  [name], [name] 06/14/2013,4:36 AM

## 2013-06-14 NOTE — Progress Notes (Signed)
SUBJECTIVE:  Patient continues to have chest pain and enzymes.  Echo showed focal anteroseptal and apical akinesis with possible bicuspid AV.  OBJECTIVE:   Vitals:   Filed Vitals:   06/14/13 0615 06/14/13 0620 06/14/13 0630 06/14/13 0645  BP: 119/77  122/75 125/70  Pulse: 87 82 93 88  Temp:      TempSrc:      Resp: 18 22 26 11   Height:      Weight:      SpO2: 99% 99% 99% 99%   I&O's:   Intake/Output Summary (Last 24 hours) at 06/14/13 4098 Last data filed at 06/14/13 0600  Gross per 24 hour  Intake  71.25 ml  Output    600 ml  Net -528.75 ml   TELEMETRY: Reviewed telemetry pt in NSR with intermittent AIVR:     PHYSICAL EXAM General: Well developed, well nourished, in no acute distress Head: Eyes PERRLA, No xanthomas.   Normal cephalic and atramatic  Lungs:   Clear bilaterally to auscultation and percussion. Heart:   HRRR S1 S2 Pulses are 2+ & equal. Abdomen: Bowel sounds are positive, abdomen soft and non-tender without masses Extremities:   No clubbing, cyanosis or edema.  DP +1 Neuro: Alert and oriented X 3. Psych:  Good affect, responds appropriately   LABS: Basic Metabolic Panel:  Recent Labs  11/91/47 0129  NA 138  K 3.6*  CL 95*  CO2 24  GLUCOSE 121*  BUN 12  CREATININE 1.00  CALCIUM 10.1   Liver Function Tests:  Recent Labs  06/14/13 0129  AST 46*  ALT 43  ALKPHOS 61  BILITOT 0.7  PROT 8.2  ALBUMIN 5.2   No results found for this basename: LIPASE, AMYLASE,  in the last 72 hours CBC:  Recent Labs  06/14/13 0129  WBC 19.9*  NEUTROABS 15.6*  HGB 15.6  HCT 44.9  MCV 84.7  PLT 274   Cardiac Enzymes:  Recent Labs  06/14/13 0129 06/14/13 0520  TROPONINI 0.35* 3.06*   BNP: No components found with this basename: POCBNP,  D-Dimer:  Recent Labs  06/14/13 0129  DDIMER <0.27   Hemoglobin A1C: No results found for this basename: HGBA1C,  in the last 72 hours Fasting Lipid Panel: No results found for this basename: CHOL,  HDL, LDLCALC, TRIG, CHOLHDL, LDLDIRECT,  in the last 72 hours Thyroid Function Tests: No results found for this basename: TSH, T4TOTAL, FREET3, T3FREE, THYROIDAB,  in the last 72 hours Anemia Panel: No results found for this basename: VITAMINB12, FOLATE, FERRITIN, TIBC, IRON, RETICCTPCT,  in the last 72 hours Coag Panel:   No results found for this basename: INR, PROTIME    RADIOLOGY: Dg Chest Port 1 View  06/14/2013   CLINICAL DATA:  Chest pain radiating down both arms. Shortness of breath.  EXAM: PORTABLE CHEST - 1 VIEW  COMPARISON:  None.  FINDINGS: The lungs are well-aerated. Pulmonary vascularity is at the upper limits of normal. There is no evidence of focal opacification, pleural effusion or pneumothorax.  The cardiomediastinal silhouette is within normal limits. No acute osseous abnormalities are seen.  IMPRESSION: No acute cardiopulmonary process seen.   Electronically Signed   By: Roanna Raider M.D.   On: 06/14/2013 02:27      ASSESSMENT:  1.  Acute NSTEMI with ongoing chest pain, troponin continues to trend upward and echo with focal wall motion abnormality in the anteroseptal region and apex.  Suspect acute coronary syndrome with acute plaque rupture in setting of vigorous  exercise.  Diff dx also includes coronary artery vasospasm and acute viral myocarditis although would not expect a focal wall motion abnormality with myocarditis.   2.  Leukocytosis most likely secondary to #1 3.  Accelerated idioventricular rhythm most likely reperfusion arrhythmias  PLAN:   1.  Discussed with Dr. Kirke CorinArida and will call cath lab in for emergent cath 2.  Continue IV Heparin gtt/ASA 3.  Lopressor 2.5mg  IV now 4.  Continue IV NTG gtt 5.  Cardiac catheterization was discussed with the patient fully including risks on myocardial infarction, death, stroke, bleeding, arrhythmia, dye allergy, renal insufficiency or bleeding.  All patient questions and concerns were discussed and the patient understands  and is willing to proceed.     Quintella ReichertURNER,TRACI R, MD  06/14/2013  8:11 AM

## 2013-06-14 NOTE — H&P (Signed)
[name] is an 28 y.o. male.   Chief Complaint: chest pain  HPI:  [name] is a 28 year old male with no past medical history. He went to the ED with sudden onset central chest pain after playing basketball this evening. He had no difficulty playing moderately vigorous basketball this evening. After stretching and cooling down he started to have a painful sensation in the chest. There was some increase in the pain with deep breathing. There wasn't any associated nausea or vomiting. There is no pain when swallowing. The patient felt that he could not get comfortable. The pain was worse with movement of the arms or chest. The pain is worse if he were to sit upright or lie flat on his back. He went home and tried to shower and eat but this did not improve the symptoms. He felt a cold sensation in his body. He decided to go to the ED after the symptoms had lasted about 90 minutes. In the ED he got nitroglycerin which improved the pain but lowered his blood pressure. ECG did not show any ischemic changes. His blood pressure is equal in both arms. He was not hypoxic or tachycardic. His d-dimer was negative and his troponin was 0.35. He was transferred here for further treatment. He feels that being jostled around on the ride over made the pain worse. He cannot recall having these symptoms before. There is no history of premature CAD or sudden cardiac death. He is a non-smoker. He doesn't exercise regularly, but has been trying to exercise more over the past month. He has never had exertional chest pain or shortness of breath. He doesn't recall any swelling in his leg. He does recall having a viral illness about 14 days ago that consisted of a sore throat. He denies fevers or chills.   History reviewed. No pertinent past medical history.  History reviewed. No pertinent past surgical history.  History reviewed. No pertinent family history. Social History:  reports that he has never smoked. He does not  have any smokeless tobacco history on file. He reports that he drinks alcohol. He reports that he does not use illicit drugs.  Allergies:  Allergies  Allergen Reactions  . Penicillins     No prescriptions prior to admission    Results for orders placed during the hospital encounter of 06/14/13 (from the past 48 hour(s))  D-DIMER, QUANTITATIVE     Status: None   Collection Time    06/14/13  1:29 AM      Result Value Range   D-Dimer, Quant <0.27  0.00 - 0.48 ug/mL-FEU   Comment:            AT THE INHOUSE ESTABLISHED CUTOFF     VALUE OF 0.48 ug/mL FEU,     THIS ASSAY HAS BEEN DOCUMENTED     IN THE LITERATURE TO HAVE     A SENSITIVITY AND NEGATIVE     PREDICTIVE VALUE OF AT LEAST     98 TO 99%.  THE TEST RESULT     SHOULD BE CORRELATED WITH     AN ASSESSMENT OF THE CLINICAL     PROBABILITY OF DVT / VTE.  COMPREHENSIVE METABOLIC PANEL     Status: Abnormal   Collection Time    06/14/13  1:29 AM      Result Value Range   Sodium 138  137 - 147 mEq/L   Potassium 3.6 (*) 3.7 - 5.3 mEq/L   Chloride 95 (*) 96 -  112 mEq/L   CO2 24  19 - 32 mEq/L   Glucose, Bld 121 (*) 70 - 99 mg/dL   BUN 12  6 - 23 mg/dL   Creatinine, Ser 1.00  0.50 - 1.35 mg/dL   Calcium 10.1  8.4 - 10.5 mg/dL   Total Protein 8.2  6.0 - 8.3 g/dL   Albumin 5.2  3.5 - 5.2 g/dL   AST 46 (*) 0 - 37 U/L   ALT 43  0 - 53 U/L   Alkaline Phosphatase 61  39 - 117 U/L   Total Bilirubin 0.7  0.3 - 1.2 mg/dL   GFR calc non Af Amer >90  >90 mL/min   GFR calc Af Amer >90  >90 mL/min   Comment: (NOTE)     The eGFR has been calculated using the CKD EPI equation.     This calculation has not been validated in all clinical situations.     eGFR's persistently <90 mL/min signify possible Chronic Kidney     Disease.  TROPONIN I     Status: Abnormal   Collection Time    06/14/13  1:29 AM      Result Value Range   Troponin I 0.35 (*) <0.30 ng/mL   Comment:            Due to the release kinetics of cTnI,     a negative result  within the first hours     of the onset of symptoms does not rule out     myocardial infarction with certainty.     If myocardial infarction is still suspected,     repeat the test at appropriate intervals.     CRITICAL RESULT CALLED TO, READ BACK BY AND VERIFIED WITH:     MAYNARD,C AT 0155 ON [id number] BY CHERESNOWSKY,T  CBC WITH DIFFERENTIAL     Status: Abnormal   Collection Time    06/14/13  1:29 AM      Result Value Range   WBC 19.9 (*) 4.0 - 10.5 K/uL   RBC 5.30  4.22 - 5.81 MIL/uL   Hemoglobin 15.6  13.0 - 17.0 g/dL   HCT 44.9  39.0 - 52.0 %   MCV 84.7  78.0 - 100.0 fL   MCH 29.4  26.0 - 34.0 pg   MCHC 34.7  30.0 - 36.0 g/dL   RDW 13.2  11.5 - 15.5 %   Platelets 274  150 - 400 K/uL   Neutrophils Relative % 78 (*) 43 - 77 %   Neutro Abs 15.6 (*) 1.7 - 7.7 K/uL   Lymphocytes Relative 14  12 - 46 %   Lymphs Abs 2.7  0.7 - 4.0 K/uL   Monocytes Relative 8  3 - 12 %   Monocytes Absolute 1.5 (*) 0.1 - 1.0 K/uL   Eosinophils Relative 1  0 - 5 %   Eosinophils Absolute 0.1  0.0 - 0.7 K/uL   Basophils Relative 0  0 - 1 %   Basophils Absolute 0.0  0.0 - 0.1 K/uL   Dg Chest Port 1 View  06/14/2013   CLINICAL DATA:  Chest pain radiating down both arms. Shortness of breath.  EXAM: PORTABLE CHEST - 1 VIEW  COMPARISON:  None.  FINDINGS: The lungs are well-aerated. Pulmonary vascularity is at the upper limits of normal. There is no evidence of focal opacification, pleural effusion or pneumothorax.  The cardiomediastinal silhouette is within normal limits. No acute osseous abnormalities are seen.  IMPRESSION: No acute  cardiopulmonary process seen.   Electronically Signed   By: Garald Balding M.D.   On: 06/14/2013 02:27    Review of Systems  Constitutional: Negative.   HENT: Negative.   Eyes: Negative.   Respiratory: Positive for shortness of breath.   Cardiovascular: Positive for chest pain.  Gastrointestinal: Negative.   Genitourinary: Negative.   Musculoskeletal: Negative.   Skin:  Negative.   Neurological: Negative.   Endo/Heme/Allergies: Negative.   Psychiatric/Behavioral: Negative.   All other systems reviewed and are negative.    Blood pressure 143/82, pulse 76, temperature 98.6 F (37 C), temperature source Oral, resp. rate 26, height '5\' 7"'  (1.702 m), weight 178 lb 5.6 oz (80.9 kg), SpO2 100.00%. Physical Exam  Nursing note and vitals reviewed. Constitutional: He is oriented to person, place, and time. He appears distressed.  Neck: No JVD present.  Cardiovascular: Normal rate, regular rhythm, S1 normal, S2 normal, normal heart sounds and intact distal pulses.  Exam reveals no distant heart sounds.   Pulses:      Carotid pulses are 2+ on the right side, and 2+ on the left side.      Radial pulses are 2+ on the right side, and 2+ on the left side.       Femoral pulses are 2+ on the right side, and 2+ on the left side.      Popliteal pulses are 2+ on the right side, and 2+ on the left side.       Dorsalis pedis pulses are 2+ on the right side, and 2+ on the left side.       Posterior tibial pulses are 2+ on the right side, and 2+ on the left side.  Respiratory: Effort normal and breath sounds normal. He has no rales.  GI: Soft. Bowel sounds are normal. He exhibits no distension. There is no tenderness.  Musculoskeletal: He exhibits no edema.  Neurological: He is alert and oriented to person, place, and time.  Skin: Skin is warm and dry. He is not diaphoretic.     ECG-shows sinus rhythm. There is sinus arrhythmia on telemetry  Assessment/Plan [name] Boccio is a pleasant 28 year old male with no significant past medical history that presents with several hours of chest pain. His symptoms are rather atypical in that they started while he was mostly cooling down from vigorous exercise. He was almost at rest when the symptoms started. There is no reproducibility to the symptoms with palpation. His ECG doesn't show ischemic ST changes. He does have sinus arrhythmia on  telemetry. He isn't having any hypoxia or tachycardia to fit with PE and his d-dimer was negative. His pulses and blood pressures are equal bilaterally, so I think dissection would be unlikely. Pericarditis is possible, although no rub on exam and ECG doesn't quite fit. Myocarditis could also be possible. He has no signs of heart failure on exam.   Chest pain -continue nitroglycerin drip  -follow troponin -lactate, bnp, sed rate and hs-crp -heparin -aspirin -prn morphine  Leukocytosis -no clear sign of infection -possibly has history of elevated WBC  Nicolo Tomko C 06/14/2013, 4:42 AM

## 2013-06-14 NOTE — ED Notes (Signed)
After 2nd nitro, pt became light headed, pale and hypotensive.  Bolus started on pt.  Will continue to monitor.

## 2013-06-14 NOTE — ED Provider Notes (Addendum)
CSN: [id number]     Arrival date & time 06/14/13  0118 History   First MD Initiated Contact with Patient 06/14/13 0125     Chief Complaint  Patient presents with  . Chest Pain   (Consider location/radiation/quality/duration/timing/severity/associated sxs/prior Treatment) HPI This is a 28 year old male without any significant past medical history. About 2 hours ago, 15 minutes after finishing a basketball game, he developed chest pain. The pain was located across the entire chest radiating down both arms. He cannot describe the pain other than to say it feels as if his chest is cold on the inside or that he cannot get the inside of his chest warm. He continues to have pain which has been constant. The pain is about an 8/10 at its worst. It is exacerbated by movement or deep breathing. He cannot find a comfortable position. There some mild shortness of breath associated with it. He did have some transient sweating and nausea earlier but is not sure if that was due to anxiety. He does not have a history of early coronary artery disease in his immediate family. He does not smoke. He denies drug use.  Patient took aspirin prior to arrival.  History reviewed. No pertinent past medical history. History reviewed. No pertinent past surgical history. History reviewed. No pertinent family history. History  Substance Use Topics  . Smoking status: Never Smoker   . Smokeless tobacco: Not on file  . Alcohol Use: Yes    Review of Systems  All other systems reviewed and are negative.    Allergies  Penicillins  Home Medications  No current outpatient prescriptions on file. BP 129/73  Pulse 80  Temp(Src) 97.6 F (36.4 C) (Oral)  Resp 18  Wt 177 lb (80.287 kg)  SpO2 100%  Physical Exam General: Well-developed, well-nourished male in no acute distress; appearance consistent with age of record HENT: normocephalic; atraumatic Eyes: pupils equal, round and reactive to light; extraocular muscles  intact; eyelashes flutter constantly when eyes are closed Neck: supple Heart: regular rate and rhythm; no murmurs, rubs or gallops; occasional PVCs Lungs: clear to auscultation bilaterally Abdomen: soft; nondistended; nontender; no masses or hepatosplenomegaly; bowel sounds present Extremities: No deformity; full range of motion; pulses normal Neurologic: Awake, alert and oriented; motor function intact in all extremities and symmetric; no facial droop Skin: Warm and dry Psychiatric: Mildly anxious     ED Course  Procedures (including critical care time)  EKG Interpretation    Date/Time:  Sunday June 14 2013 01:24:28 EST Ventricular Rate:  83 PR Interval:  132 QRS Duration: 86 QT Interval:  344 QTC Calculation: 404 R Axis:   72 Text Interpretation:  Sinus rhythm with marked sinus arrhythmia Otherwise normal ECG No old tracing to compare Confirmed by Aloura Matsuoka  MD, Mykah Bellomo ([id number]) on 06/14/2013 1:26:39 AM     EKG Interpretation    Date/Time:  Sunday June 14 2013 03:09:11 EST Ventricular Rate:  85 PR Interval:  122 QRS Duration: 84 QT Interval:  372 QTC Calculation: 442 R Axis:   67 Text Interpretation:  Sinus rhythm with marked sinus arrhythmia Otherwise normal ECG No significant change was found Confirmed by Deirdra Heumann  MD, Kaydan Wilhoite ([id number]) on 06/14/2013 3:13:08 AM        CRITICAL CARE Performed by: Paula LibraMOLPUS,Zaven Klemens L Total critical care time: 45 minutes Critical care time was exclusive of separately billable procedures and treating other patients. Critical care was necessary to treat or prevent imminent or life-threatening deterioration. Critical care was time spent personally by  me on the following activities: development of treatment plan with patient and/or surrogate as well as nursing, discussions with consultants, evaluation of patient's response to treatment, examination of patient, obtaining history from patient or surrogate, ordering and performing treatments and  interventions, ordering and review of laboratory studies, ordering and review of radiographic studies, pulse oximetry and re-evaluation of patient's condition.   MDM   Nursing notes and vitals signs, including pulse oximetry, reviewed.  Summary of this visit's results, reviewed by myself:  Labs:  Results for orders placed during the hospital encounter of 06/14/13 (from the past 24 hour(s))  D-DIMER, QUANTITATIVE     Status: None   Collection Time    06/14/13  1:29 AM      Result Value Range   D-Dimer, Quant <0.27  0.00 - 0.48 ug/mL-FEU  COMPREHENSIVE METABOLIC PANEL     Status: Abnormal   Collection Time    06/14/13  1:29 AM      Result Value Range   Sodium 138  137 - 147 mEq/L   Potassium 3.6 (*) 3.7 - 5.3 mEq/L   Chloride 95 (*) 96 - 112 mEq/L   CO2 24  19 - 32 mEq/L   Glucose, Bld 121 (*) 70 - 99 mg/dL   BUN 12  6 - 23 mg/dL   Creatinine, Ser 1.61  0.50 - 1.35 mg/dL   Calcium 09.6  8.4 - 04.5 mg/dL   Total Protein 8.2  6.0 - 8.3 g/dL   Albumin 5.2  3.5 - 5.2 g/dL   AST 46 (*) 0 - 37 U/L   ALT 43  0 - 53 U/L   Alkaline Phosphatase 61  39 - 117 U/L   Total Bilirubin 0.7  0.3 - 1.2 mg/dL   GFR calc non Af Amer >90  >90 mL/min   GFR calc Af Amer >90  >90 mL/min  TROPONIN I     Status: Abnormal   Collection Time    06/14/13  1:29 AM      Result Value Range   Troponin I 0.35 (*) <0.30 ng/mL  CBC WITH DIFFERENTIAL     Status: Abnormal   Collection Time    06/14/13  1:29 AM      Result Value Range   WBC 19.9 (*) 4.0 - 10.5 K/uL   RBC 5.30  4.22 - 5.81 MIL/uL   Hemoglobin 15.6  13.0 - 17.0 g/dL   HCT 40.9  81.1 - 91.4 %   MCV 84.7  78.0 - 100.0 fL   MCH 29.4  26.0 - 34.0 pg   MCHC 34.7  30.0 - 36.0 g/dL   RDW 78.2  95.6 - 21.3 %   Platelets 274  150 - 400 K/uL   Neutrophils Relative % 78 (*) 43 - 77 %   Neutro Abs 15.6 (*) 1.7 - 7.7 K/uL   Lymphocytes Relative 14  12 - 46 %   Lymphs Abs 2.7  0.7 - 4.0 K/uL   Monocytes Relative 8  3 - 12 %   Monocytes Absolute 1.5  (*) 0.1 - 1.0 K/uL   Eosinophils Relative 1  0 - 5 %   Eosinophils Absolute 0.1  0.0 - 0.7 K/uL   Basophils Relative 0  0 - 1 %   Basophils Absolute 0.0  0.0 - 0.1 K/uL    Imaging Studies: Dg Chest Port 1 View  06/14/2013   CLINICAL DATA:  Chest pain radiating down both arms. Shortness of breath.  EXAM: PORTABLE CHEST -  1 VIEW  COMPARISON:  None.  FINDINGS: The lungs are well-aerated. Pulmonary vascularity is at the upper limits of normal. There is no evidence of focal opacification, pleural effusion or pneumothorax.  The cardiomediastinal silhouette is within normal limits. No acute osseous abnormalities are seen.  IMPRESSION: No acute cardiopulmonary process seen.   Electronically Signed   By: Roanna Raider M.D.   On: 06/14/2013 02:27    2:37 AM Patient's blood pressure is equal in both arms. Pain improved with sublingual nitroglycerin but pain returned. Transient hypotension improved with IV fluid bolus. Patient now placed on nitroglycerin drip. Patient advised of elevated troponin and leukocytosis. We will have the patient transferred to [facility] cardiology service per discussion with Dr. Ihor Gully.  Differential diagnosis includes myocarditis, non-ST elevation MI.      Hanley Seamen, MD 06/14/13 0245  Hanley Seamen, MD 06/14/13 [phone]

## 2013-06-14 NOTE — ED Notes (Addendum)
Ted from lab called. Pt with a elevated troponin 0.35. Dr. Read DriversMolpus aware.

## 2013-06-14 NOTE — Progress Notes (Signed)
Pt c/o chest pain without relief. EKG obtained Dr Mayford Knifeurner notified and in to see pt. Bedside echo in process. Father at bedside.

## 2013-06-14 NOTE — CV Procedure (Signed)
Cardiac Catheterization Procedure Note  Name: [name] MRN: [id number] DOB: 02/28/1986  Procedure: Left Heart Cath, Selective Coronary Angiography, aspiration thrombectomy and drug-eluting stent placement to the mid LAD X 2, intravascular ultrasound of the LAD.  Indication: Non-ST elevation myocardial infarction with ongoing chest pain. Initial EKG was normal. Subsequent EKGs showed ST elevation mainly in V2 with minor elevation in V1 which did not meet criteria for ST elevation myocardial infarction. Given his atypical presentation, there was a concern about possible myocarditis. Thus, a stat echocardiogram was performed which showed no evidence of pericardial effusion with wall motion abnormalities suggestive of anterior myocardial infarction. Thus, urgent cardiac catheterization was recommended.  Medications:  Sedation:  2 mg IV Versed, 150 mcg IV Fentanyl  Contrast:  305 ML Omnipaque  Procedural Details: The right wrist was prepped, draped, and anesthetized with 1% lidocaine. Using the modified Seldinger technique, a 5 French Slender sheath was introduced into the right radial artery. 3 mg of verapamil was administered through the sheath, weight-based unfractionated heparin was administered intravenously. A JR 4 diagnostic catheter was used for right coronary angiography. There was difficulty engaging the left main coronary artery with Judkins catheters. The left main was engaged with an XB LAD 3.5 guiding catheter. PCI was performed through this guide. Left ventricular pressure was recorded with the right Judkins catheter. Catheter exchanges were performed over an exchange length guidewire. There were no immediate procedural complications.  Procedural Findings:  Hemodynamics: AO:  117/85   mmHg LV:  113/7    mmHg LVEDP: 20  mmHg  Coronary angiography: Coronary dominance: Right   Left Main:  The left main coronary artery has angulated takeoff from the aorta but no evidence of  atherosclerosis.  Left Anterior Descending (LAD):  Large in size and occluded in the midsegment with TIMI 0 flow. Large thrombus is noted.  1st diagonal (D1):  Small in size with no significant disease.  2nd diagonal (D2):  Small in size with no significant disease.  3rd diagonal (D3):  Small in size with no significant disease.  Circumflex (LCx):  Normal in size and nondominant. The vessel has no significant disease.   Right Coronary Artery: Large in size and dominant. The vessel has no significant disease.  Posterior descending artery: Normal in size with no significant disease.  Posterior AV segment: Normal in size with no significant disease.  Posterolateral branchs:  PL 1 is large in size with no significant disease. PL 2 is normal in size with no significant disease.  Left ventriculography: Was not performed as the patient had an echocardiogram this morning.  PCI Note:  Following the diagnostic procedure, the decision was made to proceed with PCI.  Weight-based bivalirudin was given for anticoagulation. Once a therapeutic ACT was achieved, a 6 JamaicaFrench XB LAD 3.5 guide catheter was inserted.  A run through coronary guidewire was used to cross the lesion. Aspiration nephrectomy was performed x2 with retrieval of large amount of thrombus. The lesion was then stented with a 3.25 x 18 mm Xience drug-eluting stent.    Following PCI, there was 0% residual stenosis and TIMI-3 flow. A small residual thrombus was noted which was not affecting the blood flow. I elected to start treatment with Tirofiban. I was satisfied with the results. The wire and guiding catheter were removed. However, the patient had recurrent chest and arm discomfort similar to his initial presentation. Thus, I decided to reimage his coronary arteries to exclude embolization. The RCA appeared unchanged. In the LAD,  the area distal to the stent looked hazy and suggestive of distal edge dissection. This improved with  intracoronary nitroglycerin but did not resolve. Intravascular ultrasound was performed which showed the stent was well opposed. No convincing evidence of edge dissection was noted. However, the area appeared narrowed distal to the stent. Due to that, I decided to treat this area with an overlapping stent 2.75 x 18 mm Xience drug-eluting stent. The overlap area was post dilated with a 3.25 x 20 noncompliant balloon to 16 atmospheres. Final angiography confirmed an excellent result. The patient tolerated the procedure well. There were no immediate procedural complications. A TR band was used for radial hemostasis. The patient was transferred to the post catheterization recovery area for further monitoring.  PCI Data: Vessel - LAD/Segment - mid Percent Stenosis (pre)  100% TIMI-flow 0 Stent 3.25 x 18 mm overlap with a 2.75 x 18 mm Xience drug-eluting stents Percent Stenosis (post) 0% TIMI-flow (post) 3   Final Conclusions:  1. Severe one-vessel coronary artery disease with occluded mid LAD due to what seems to be a plaque rupture. No other obstructive disease is noted. 2. Moderately elevated left ventricular end-diastolic pressure. Mild to moderately reduced LV systolic function by echocardiogram with an EF of 40-45% 3. Successful aspiration thrombectomy and drug-eluting stent placement x2 to the mid LAD. A second stent was placed due to suspected edge dissection.  Recommendations:  Continue dual antiplatelet therapy for at least one year. Treat with tirofiban for 18 hours.   Lorine Bears MD, [facility] 06/14/2013, 11:01 AM

## 2013-06-15 DIAGNOSIS — I2109 ST elevation (STEMI) myocardial infarction involving other coronary artery of anterior wall: Secondary | ICD-10-CM

## 2013-06-15 DIAGNOSIS — Z872 Personal history of diseases of the skin and subcutaneous tissue: Secondary | ICD-10-CM

## 2013-06-15 DIAGNOSIS — T43625A Adverse effect of amphetamines, initial encounter: Secondary | ICD-10-CM | POA: Diagnosis present

## 2013-06-15 LAB — BASIC METABOLIC PANEL
BUN: 5 mg/dL — ABNORMAL LOW (ref 6–23)
CO2: 22 mEq/L (ref 19–32)
CREATININE: 0.66 mg/dL (ref 0.50–1.35)
Calcium: 8.7 mg/dL (ref 8.4–10.5)
Chloride: 99 mEq/L (ref 96–112)
Glucose, Bld: 116 mg/dL — ABNORMAL HIGH (ref 70–99)
Potassium: 3.7 mEq/L (ref 3.7–5.3)
Sodium: 136 mEq/L — ABNORMAL LOW (ref 137–147)

## 2013-06-15 LAB — CBC
HCT: 41.1 % (ref 39.0–52.0)
Hemoglobin: 14.2 g/dL (ref 13.0–17.0)
MCH: 29.5 pg (ref 26.0–34.0)
MCHC: 34.5 g/dL (ref 30.0–36.0)
MCV: 85.4 fL (ref 78.0–100.0)
PLATELETS: 275 10*3/uL (ref 150–400)
RBC: 4.81 MIL/uL (ref 4.22–5.81)
RDW: 13.6 % (ref 11.5–15.5)
WBC: 13.5 10*3/uL — ABNORMAL HIGH (ref 4.0–10.5)

## 2013-06-15 LAB — TROPONIN I
Troponin I: >20 ng/mL (ref <0.30)
Troponin I: >20 ng/mL (ref <0.30)

## 2013-06-15 LAB — POCT ACTIVATED CLOTTING TIME
Activated Clotting Time: 548 seconds
Activated Clotting Time: 343 seconds

## 2013-06-15 LAB — TSH: TSH: 0.626 u[IU]/mL (ref 0.350–4.500)

## 2013-06-15 MED ORDER — ISOSORBIDE MONONITRATE 15 MG HALF TABLET
15.0000 mg | ORAL_TABLET | Freq: Every day | ORAL | Status: DC
  Administered 2013-06-16 – 2013-06-17 (×2): 15 mg via ORAL
  Filled 2013-06-15 (×4): qty 1

## 2013-06-15 MED FILL — Sodium Chloride IV Soln 0.9%: INTRAVENOUS | Qty: 50 | Status: AC

## 2013-06-15 NOTE — Progress Notes (Signed)
CARDIAC REHAB PHASE I   PRE:  Rate/Rhythm: 94 SR    BP: sitting 86/49, standing84/52    SaO2: 100 2L  MODE:  Ambulation: 350 ft   POST:  Rate/Rhythm: 108 ST standing, relaxed to 98 walking    BP: sitting 95/68     SaO2: 98 RA  BP initially low without sx. Felt good walking. BP better after walk. Return to recliner. Began ed. Voiced understanding, very receptive. Will f/u tomorrow. [id number]   Elissa LovettReeve, Cotton Beckley BloomfieldKristan CES, ACSM 06/15/2013 12:09 PM

## 2013-06-15 NOTE — Care Management Note (Signed)
    Page 1 of 1   06/15/2013     10:34:41 AM   CARE MANAGEMENT NOTE 06/15/2013  Patient:  [name] LinCAUDLE,Vadim M   Account Number:  [id number]  Date Initiated:  06/15/2013  Documentation initiated by:  Junius CreamerWELL,DEBBIE  Subjective/Objective Assessment:   adm w mi     Action/Plan:   lives w fam   Anticipated DC Date:     Anticipated DC Plan:        DC Planning Services  CM consult  Medication Assistance      Choice offered to / List presented to:             Status of service:   Medicare Important Message given?   (If response is "NO", the following Medicare IM given date fields will be blank) Date Medicare IM given:   Date Additional Medicare IM given:    Discharge Disposition:  HOME/SELF CARE  Per UR Regulation:  Reviewed for med. necessity/level of care/duration of stay  If discussed at Long Length of Stay Meetings, dates discussed:    Comments:  1/26

## 2013-06-15 NOTE — Progress Notes (Signed)
DAILY PROGRESS NOTE  Subjective:  Complains of 2-3/10 chest ache - much improved from last night. EKG today shows evolving anterior ST elevation. No arrhythmias noted overnight. He has no significant family history of CAD - his only chronic medical problem is hidradenitis suppurativa. He has been weaned off Aggrastat and is currently on ASA and Effient. His UDS is positive for amphetamines and opiates (he had been given morphine).  When questioned about this, he reports that he had been given a "caffeine" pill by a friend on Saturday prior to the onset of chest pain. He does report greater than normal increase in energy symptoms.  Objective:  Temp:  [98.6 F (37 C)-99.8 F (37.7 C)] 98.6 F (37 C) (01/26 0700) Pulse Rate:  [68-116] 94 (01/26 0800) Resp:  [9-29] 24 (01/26 0800) BP: (99-126)/(42-93) 101/53 mmHg (01/26 0800) SpO2:  [96 %-100 %] 99 % (01/26 0800) Weight change:   Intake/Output from previous day: 01/25 0701 - 01/26 0700 In: 405 [I.V.:405] Out: 1850 [Urine:1850]  Intake/Output from this shift: Total I/O In: 6 [I.V.:6] Out: -   Medications: Current Facility-Administered Medications  Medication Dose Route Frequency Provider Last Rate Last Dose  . acetaminophen (TYLENOL) tablet 650 mg  650 mg Oral Q4H PRN Javier Docker, MD      . aspirin EC tablet 81 mg  81 mg Oral Daily Javier Docker, MD      . atorvastatin (LIPITOR) tablet 40 mg  40 mg Oral q1800 Javier Docker, MD   40 mg at 06/14/13 1817  . metoprolol tartrate (LOPRESSOR) tablet 25 mg  25 mg Oral BID Candee Furbish, MD   25 mg at 06/14/13 2251  . morphine 4 MG/ML injection 4 mg  4 mg Intravenous Q1H PRN Javier Docker, MD   4 mg at 06/15/13 0533  . nitroGLYCERIN (NITROSTAT) SL tablet 0.4 mg  0.4 mg Sublingual Q5 min PRN Karen Chafe Molpus, MD   0.4 mg at 06/14/13 0149  . ondansetron (ZOFRAN) injection 4 mg  4 mg Intravenous Q6H PRN Javier Docker, MD   4 mg at 06/14/13 1259  . prasugrel (EFFIENT) tablet 10 mg  10 mg Oral Daily Candee Furbish, MD        Physical Exam: General appearance: alert and no distress Neck: no adenopathy, no carotid bruit and no JVD Lungs: clear to auscultation bilaterally Heart: regular rate and rhythm, S1, S2 normal, no murmur, click, rub or gallop Abdomen: soft, non-tender; bowel sounds normal; no masses,  no organomegaly Extremities: extremities normal, atraumatic, no cyanosis or edema Pulses: 2+ and symmetric radial cath site is without ecchymosis or bruit Skin: Skin color, texture, turgor normal. No rashes or lesions Neurologic: Alert and oriented X 3, normal strength and tone. Normal symmetric reflexes. Normal coordination and gait Psych: Normal mood, affect  Lab Results: Results for orders placed during the hospital encounter of 06/14/13 (from the past 48 hour(s))  D-DIMER, QUANTITATIVE     Status: None   Collection Time    06/14/13  1:29 AM      Result Value Range   D-Dimer, Quant <0.27  0.00 - 0.48 ug/mL-FEU   Comment:            AT THE INHOUSE ESTABLISHED CUTOFF     VALUE OF 0.48 ug/mL FEU,     THIS ASSAY HAS BEEN DOCUMENTED     IN THE LITERATURE TO HAVE     A SENSITIVITY AND NEGATIVE     PREDICTIVE VALUE OF AT LEAST  98 TO 99%.  THE TEST RESULT     SHOULD BE CORRELATED WITH     AN ASSESSMENT OF THE CLINICAL     PROBABILITY OF DVT / VTE.  COMPREHENSIVE METABOLIC PANEL     Status: Abnormal   Collection Time    06/14/13  1:29 AM      Result Value Range   Sodium 138  137 - 147 mEq/L   Potassium 3.6 (*) 3.7 - 5.3 mEq/L   Chloride 95 (*) 96 - 112 mEq/L   CO2 24  19 - 32 mEq/L   Glucose, Bld 121 (*) 70 - 99 mg/dL   BUN 12  6 - 23 mg/dL   Creatinine, Ser 1.00  0.50 - 1.35 mg/dL   Calcium 10.1  8.4 - 10.5 mg/dL   Total Protein 8.2  6.0 - 8.3 g/dL   Albumin 5.2  3.5 - 5.2 g/dL   AST 46 (*) 0 - 37 U/L   ALT 43  0 - 53 U/L   Alkaline Phosphatase 61  39 - 117 U/L   Total Bilirubin 0.7  0.3 - 1.2 mg/dL   GFR calc non Af Amer >90  >90 mL/min   GFR calc Af Amer >90  >90  mL/min   Comment: (NOTE)     The eGFR has been calculated using the CKD EPI equation.     This calculation has not been validated in all clinical situations.     eGFR's persistently <90 mL/min signify possible Chronic Kidney     Disease.  TROPONIN I     Status: Abnormal   Collection Time    06/14/13  1:29 AM      Result Value Range   Troponin I 0.35 (*) <0.30 ng/mL   Comment:            Due to the release kinetics of cTnI,     a negative result within the first hours     of the onset of symptoms does not rule out     myocardial infarction with certainty.     If myocardial infarction is still suspected,     repeat the test at appropriate intervals.     CRITICAL RESULT CALLED TO, READ BACK BY AND VERIFIED WITH:     MAYNARD,C AT 0155 ON [id number] BY CHERESNOWSKY,T  CBC WITH DIFFERENTIAL     Status: Abnormal   Collection Time    06/14/13  1:29 AM      Result Value Range   WBC 19.9 (*) 4.0 - 10.5 K/uL   RBC 5.30  4.22 - 5.81 MIL/uL   Hemoglobin 15.6  13.0 - 17.0 g/dL   HCT 44.9  39.0 - 52.0 %   MCV 84.7  78.0 - 100.0 fL   MCH 29.4  26.0 - 34.0 pg   MCHC 34.7  30.0 - 36.0 g/dL   RDW 13.2  11.5 - 15.5 %   Platelets 274  150 - 400 K/uL   Neutrophils Relative % 78 (*) 43 - 77 %   Neutro Abs 15.6 (*) 1.7 - 7.7 K/uL   Lymphocytes Relative 14  12 - 46 %   Lymphs Abs 2.7  0.7 - 4.0 K/uL   Monocytes Relative 8  3 - 12 %   Monocytes Absolute 1.5 (*) 0.1 - 1.0 K/uL   Eosinophils Relative 1  0 - 5 %   Eosinophils Absolute 0.1  0.0 - 0.7 K/uL   Basophils Relative 0  0 - 1 %  Basophils Absolute 0.0  0.0 - 0.1 K/uL  MRSA PCR SCREENING     Status: None   Collection Time    06/14/13  3:58 AM      Result Value Range   MRSA by PCR NEGATIVE  NEGATIVE   Comment:            The GeneXpert MRSA Assay (FDA     approved for NASAL specimens     only), is one component of a     comprehensive MRSA colonization     surveillance program. It is not     intended to diagnose MRSA     infection nor to  guide or     monitor treatment for     MRSA infections.  LACTIC ACID, PLASMA     Status: None   Collection Time    06/14/13  5:20 AM      Result Value Range   Lactic Acid, Venous 1.1  0.5 - 2.2 mmol/L  TROPONIN I     Status: Abnormal   Collection Time    06/14/13  5:20 AM      Result Value Range   Troponin I 3.06 (*) <0.30 ng/mL   Comment:            Due to the release kinetics of cTnI,     a negative result within the first hours     of the onset of symptoms does not rule out     myocardial infarction with certainty.     If myocardial infarction is still suspected,     repeat the test at appropriate intervals.     CRITICAL RESULT CALLED TO, READ BACK BY AND VERIFIED WITH:     KUFFOUR M,RN 06/14/13 0613 WAYK  PRO B NATRIURETIC PEPTIDE     Status: None   Collection Time    06/14/13  5:20 AM      Result Value Range   Pro B Natriuretic peptide (BNP) 66.0  0 - 125 pg/mL  URINE RAPID DRUG SCREEN (HOSP PERFORMED)     Status: Abnormal   Collection Time    06/14/13  6:06 AM      Result Value Range   Opiates POSITIVE (*) NONE DETECTED   Cocaine NONE DETECTED  NONE DETECTED   Benzodiazepines NONE DETECTED  NONE DETECTED   Amphetamines POSITIVE (*) NONE DETECTED   Tetrahydrocannabinol NONE DETECTED  NONE DETECTED   Barbiturates NONE DETECTED  NONE DETECTED   Comment:            DRUG SCREEN FOR MEDICAL PURPOSES     ONLY.  IF CONFIRMATION IS NEEDED     FOR ANY PURPOSE, NOTIFY LAB     WITHIN 5 DAYS.                LOWEST DETECTABLE LIMITS     FOR URINE DRUG SCREEN     Drug Class       Cutoff (ng/mL)     Amphetamine      1000     Barbiturate      200     Benzodiazepine   638     Tricyclics       466     Opiates          300     Cocaine          300     THC              50  TROPONIN  I     Status: Abnormal   Collection Time    06/14/13 12:00 PM      Result Value Range   Troponin I >20.00 (*) <0.30 ng/mL   Comment: CRITICAL VALUE NOTED.  VALUE IS CONSISTENT WITH PREVIOUSLY  REPORTED AND CALLED VALUE.  TROPONIN I     Status: Abnormal   Collection Time    06/14/13  8:20 PM      Result Value Range   Troponin I >20.00 (*) <0.30 ng/mL   Comment: CRITICAL VALUE NOTED.  VALUE IS CONSISTENT WITH PREVIOUSLY REPORTED AND CALLED VALUE.                Due to the release kinetics of cTnI,     a negative result within the first hours     of the onset of symptoms does not rule out     myocardial infarction with certainty.     If myocardial infarction is still suspected,     repeat the test at appropriate intervals.  CBC     Status: Abnormal   Collection Time    06/14/13  8:20 PM      Result Value Range   WBC 16.3 (*) 4.0 - 10.5 K/uL   RBC 4.66  4.22 - 5.81 MIL/uL   Hemoglobin 13.9  13.0 - 17.0 g/dL   HCT 39.8  39.0 - 52.0 %   MCV 85.4  78.0 - 100.0 fL   MCH 29.8  26.0 - 34.0 pg   MCHC 34.9  30.0 - 36.0 g/dL   RDW 13.7  11.5 - 15.5 %   Platelets 277  150 - 400 K/uL  TROPONIN I     Status: Abnormal   Collection Time    06/15/13  2:45 AM      Result Value Range   Troponin I >20.00 (*) <0.30 ng/mL   Comment: CRITICAL VALUE NOTED.  VALUE IS CONSISTENT WITH PREVIOUSLY REPORTED AND CALLED VALUE.  CBC     Status: Abnormal   Collection Time    06/15/13  2:45 AM      Result Value Range   WBC 13.5 (*) 4.0 - 10.5 K/uL   RBC 4.81  4.22 - 5.81 MIL/uL   Hemoglobin 14.2  13.0 - 17.0 g/dL   HCT 41.1  39.0 - 52.0 %   MCV 85.4  78.0 - 100.0 fL   MCH 29.5  26.0 - 34.0 pg   MCHC 34.5  30.0 - 36.0 g/dL   RDW 13.6  11.5 - 15.5 %   Platelets 275  150 - 400 K/uL  BASIC METABOLIC PANEL     Status: Abnormal   Collection Time    06/15/13  2:45 AM      Result Value Range   Sodium 136 (*) 137 - 147 mEq/L   Potassium 3.7  3.7 - 5.3 mEq/L   Chloride 99  96 - 112 mEq/L   CO2 22  19 - 32 mEq/L   Glucose, Bld 116 (*) 70 - 99 mg/dL   BUN 5 (*) 6 - 23 mg/dL   Creatinine, Ser 0.66  0.50 - 1.35 mg/dL   Calcium 8.7  8.4 - 10.5 mg/dL   GFR calc non Af Amer >90  >90 mL/min   GFR calc  Af Amer >90  >90 mL/min   Comment: (NOTE)     The eGFR has been calculated using the CKD EPI equation.     This calculation has not been validated in all clinical  situations.     eGFR's persistently <90 mL/min signify possible Chronic Kidney     Disease.    Imaging: Dg Chest Port 1 View  06/14/2013   CLINICAL DATA:  Chest pain radiating down both arms. Shortness of breath.  EXAM: PORTABLE CHEST - 1 VIEW  COMPARISON:  None.  FINDINGS: The lungs are well-aerated. Pulmonary vascularity is at the upper limits of normal. There is no evidence of focal opacification, pleural effusion or pneumothorax.  The cardiomediastinal silhouette is within normal limits. No acute osseous abnormalities are seen.  IMPRESSION: No acute cardiopulmonary process seen.   Electronically Signed   By: Garald Balding M.D.   On: 06/14/2013 02:27    Assessment:  Principal Problem:   Acute ST elevation myocardial infarction (STEMI) involving left anterior descending coronary artery Active Problems:   Chest pain   History of hidradenitis suppurativa   Amphetamine adverse reaction   Plan:  1. [name] is feeling better, but his chest pain and ST elevation has not totally resolved. His UDS was positive for amphetamine and it seems that he may have unknowingly (per his report) taken an amphetamine Saturday which could have precipitated his heart attack.  Ok to ambulated around unit today with cardiac rehab.  Switch nitroglycerin to imdur today. Check fasting lipid profile in the am tomorrow.  Time Spent Directly with Patient:  30 minutes  Length of Stay:  LOS: 1 day   Pixie Casino, MD, [facility] Attending Cardiologist [facility]  HILTY,Kenneth C 06/15/2013, 8:39 AM

## 2013-06-16 DIAGNOSIS — Z872 Personal history of diseases of the skin and subcutaneous tissue: Secondary | ICD-10-CM

## 2013-06-16 LAB — LIPID PANEL
Cholesterol: 141 mg/dL (ref 0–200)
HDL: 59 mg/dL (ref >39)
LDL Cholesterol: 62 mg/dL (ref 0–99)
Total CHOL/HDL Ratio: 2.4 RATIO
Triglycerides: 102 mg/dL (ref <150)
VLDL: 20 mg/dL (ref 0–40)

## 2013-06-16 LAB — CBC
HCT: 40.7 % (ref 39.0–52.0)
HEMOGLOBIN: 14.2 g/dL (ref 13.0–17.0)
MCH: 30 pg (ref 26.0–34.0)
MCHC: 34.9 g/dL (ref 30.0–36.0)
MCV: 85.9 fL (ref 78.0–100.0)
PLATELETS: 272 10*3/uL (ref 150–400)
RBC: 4.74 MIL/uL (ref 4.22–5.81)
RDW: 13.5 % (ref 11.5–15.5)
WBC: 9.9 10*3/uL (ref 4.0–10.5)

## 2013-06-16 LAB — BASIC METABOLIC PANEL
BUN: 10 mg/dL (ref 6–23)
CO2: 24 mEq/L (ref 19–32)
Calcium: 9.1 mg/dL (ref 8.4–10.5)
Chloride: 98 mEq/L (ref 96–112)
Creatinine, Ser: 0.84 mg/dL (ref 0.50–1.35)
GFR calc non Af Amer: >90 mL/min (ref >90)
Glucose, Bld: 95 mg/dL (ref 70–99)
Potassium: 4 mEq/L (ref 3.7–5.3)
Sodium: 137 mEq/L (ref 137–147)

## 2013-06-16 NOTE — Progress Notes (Signed)
CARDIAC REHAB PHASE I   PRE:  Rate/Rhythm: 81 SR    BP: sitting 90/43    SaO2:   MODE:  Ambulation: 700 ft   POST:  Rate/Rhythm: 94 SR    BP: sitting 87/49     SaO2:   BP continues to be low but pt not symptomatic. Tolerated walk well, no c/o. Quick pace. Ed completed with father in the room. Pt has been watching his diet and exercising some before admit. Encouraged slow increase in ex intensity and duration. Pt interested in CRPII and will send referral to [place] and [place]. Pt very receptive to education.  [id number]   Elissa LovettReeve, Lateasha Breuer RadcliffeKristan CES, ACSM 06/16/2013 12:05 PM

## 2013-06-16 NOTE — Progress Notes (Signed)
DAILY PROGRESS NOTE  Subjective:  No further chest pain. Feels much better today. No arrhythmias noted overnight. Troponin remains >20 x 5, suggesting significant myocardial injury.  Leukocytosis is resolving.  Cholesterol profile is TC 141, TG 102, HDL 59, LDL 62.  Would still recommend low dose statin as he is getting.  Objective:  Temp:  [98.4 F (36.9 [name])-99.8 F (37.7 [name])] 98.5 F (36.9 [name]) (01/27 0800) Pulse Rate:  [73-110] 73 (01/27 0800) Resp:  [13-22] 20 (01/27 0000) BP: (76-108)/(43-66) 106/59 mmHg (01/27 0800) SpO2:  [96 %-100 %] 96 % (01/27 0800) Weight change:   Intake/Output from previous day: 01/26 0701 - 01/27 0700 In: 6 [I.V.:6] Out: -   Intake/Output from this shift:    Medications: Current Facility-Administered Medications  Medication Dose Route Frequency Provider Last Rate Last Dose  . acetaminophen (TYLENOL) tablet 650 mg  650 mg Oral Q4H PRN Javier Docker, MD      . aspirin EC tablet 81 mg  81 mg Oral Daily Javier Docker, MD   81 mg at 06/15/13 1013  . atorvastatin (LIPITOR) tablet 40 mg  40 mg Oral q1800 Javier Docker, MD   40 mg at 06/15/13 1717  . isosorbide mononitrate (IMDUR) 24 hr tablet 15 mg  15 mg Oral Daily Pixie Casino, MD      . metoprolol tartrate (LOPRESSOR) tablet 25 mg  25 mg Oral BID Candee Furbish, MD   25 mg at 06/15/13 2153  . morphine 4 MG/ML injection 4 mg  4 mg Intravenous Q1H PRN Javier Docker, MD   4 mg at 06/15/13 0533  . nitroGLYCERIN (NITROSTAT) SL tablet 0.4 mg  0.4 mg Sublingual Q5 min PRN Karen Chafe Molpus, MD   0.4 mg at 06/14/13 0149  . ondansetron (ZOFRAN) injection 4 mg  4 mg Intravenous Q6H PRN Javier Docker, MD   4 mg at 06/14/13 1259  . prasugrel (EFFIENT) tablet 10 mg  10 mg Oral Daily Candee Furbish, MD   10 mg at 06/15/13 1009    Physical Exam: General appearance: alert and no distress Neck: no adenopathy, no carotid bruit and no JVD Lungs: clear to auscultation bilaterally Heart: regular rate and rhythm, S1, S2 normal, no  murmur, click, rub or gallop Abdomen: soft, non-tender; bowel sounds normal; no masses,  no organomegaly Extremities: extremities normal, atraumatic, no cyanosis or edema Pulses: 2+ and symmetric radial cath site is without ecchymosis or bruit Skin: Skin color, texture, turgor normal. No rashes or lesions Neurologic: Alert and oriented X 3, normal strength and tone. Normal symmetric reflexes. Normal coordination and gait Psych: Normal mood, affect  Lab Results: Results for orders placed during the hospital encounter of 06/14/13 (from the past 48 hour(s))  POCT ACTIVATED CLOTTING TIME     Status: None   Collection Time    06/14/13  9:33 AM      Result Value Range   Activated Clotting Time 548    POCT ACTIVATED CLOTTING TIME     Status: None   Collection Time    06/14/13 10:33 AM      Result Value Range   Activated Clotting Time 343    TROPONIN I     Status: Abnormal   Collection Time    06/14/13 12:00 PM      Result Value Range   Troponin I >20.00 (*) <0.30 ng/mL   Comment: CRITICAL VALUE NOTED.  VALUE IS CONSISTENT WITH PREVIOUSLY REPORTED AND CALLED VALUE.  TROPONIN I  Status: Abnormal   Collection Time    06/14/13  8:20 PM      Result Value Range   Troponin I >20.00 (*) <0.30 ng/mL   Comment: CRITICAL VALUE NOTED.  VALUE IS CONSISTENT WITH PREVIOUSLY REPORTED AND CALLED VALUE.                Due to the release kinetics of cTnI,     a negative result within the first hours     of the onset of symptoms does not rule out     myocardial infarction with certainty.     If myocardial infarction is still suspected,     repeat the test at appropriate intervals.  CBC     Status: Abnormal   Collection Time    06/14/13  8:20 PM      Result Value Range   WBC 16.3 (*) 4.0 - 10.5 K/uL   RBC 4.66  4.22 - 5.81 MIL/uL   Hemoglobin 13.9  13.0 - 17.0 g/dL   HCT 39.8  39.0 - 52.0 %   MCV 85.4  78.0 - 100.0 fL   MCH 29.8  26.0 - 34.0 pg   MCHC 34.9  30.0 - 36.0 g/dL   RDW 13.7  11.5  - 15.5 %   Platelets 277  150 - 400 K/uL  TROPONIN I     Status: Abnormal   Collection Time    06/15/13  2:45 AM      Result Value Range   Troponin I >20.00 (*) <0.30 ng/mL   Comment: CRITICAL VALUE NOTED.  VALUE IS CONSISTENT WITH PREVIOUSLY REPORTED AND CALLED VALUE.  CBC     Status: Abnormal   Collection Time    06/15/13  2:45 AM      Result Value Range   WBC 13.5 (*) 4.0 - 10.5 K/uL   RBC 4.81  4.22 - 5.81 MIL/uL   Hemoglobin 14.2  13.0 - 17.0 g/dL   HCT 41.1  39.0 - 52.0 %   MCV 85.4  78.0 - 100.0 fL   MCH 29.5  26.0 - 34.0 pg   MCHC 34.5  30.0 - 36.0 g/dL   RDW 13.6  11.5 - 15.5 %   Platelets 275  150 - 400 K/uL  BASIC METABOLIC PANEL     Status: Abnormal   Collection Time    06/15/13  2:45 AM      Result Value Range   Sodium 136 (*) 137 - 147 mEq/L   Potassium 3.7  3.7 - 5.3 mEq/L   Chloride 99  96 - 112 mEq/L   CO2 22  19 - 32 mEq/L   Glucose, Bld 116 (*) 70 - 99 mg/dL   BUN 5 (*) 6 - 23 mg/dL   Creatinine, Ser 0.66  0.50 - 1.35 mg/dL   Calcium 8.7  8.4 - 10.5 mg/dL   GFR calc non Af Amer >90  >90 mL/min   GFR calc Af Amer >90  >90 mL/min   Comment: (NOTE)     The eGFR has been calculated using the CKD EPI equation.     This calculation has not been validated in all clinical situations.     eGFR's persistently <90 mL/min signify possible Chronic Kidney     Disease.  TROPONIN I     Status: Abnormal   Collection Time    06/15/13 10:20 AM      Result Value Range   Troponin I >20.00 (*) <0.30 ng/mL   Comment: CRITICAL  VALUE NOTED.  VALUE IS CONSISTENT WITH PREVIOUSLY REPORTED AND CALLED VALUE.  TSH     Status: None   Collection Time    06/15/13 10:20 AM      Result Value Range   TSH 0.626  0.350 - 4.500 uIU/mL   Comment: Performed at [facility]  TROPONIN I     Status: Abnormal   Collection Time    06/15/13  5:15 PM      Result Value Range   Troponin I >20.00 (*) <0.30 ng/mL   Comment:            Due to the release kinetics of cTnI,     a  negative result within the first hours     of the onset of symptoms does not rule out     myocardial infarction with certainty.     If myocardial infarction is still suspected,     repeat the test at appropriate intervals.     CRITICAL VALUE NOTED.  VALUE IS CONSISTENT WITH PREVIOUSLY REPORTED AND CALLED VALUE.  CBC     Status: None   Collection Time    06/16/13  4:15 AM      Result Value Range   WBC 9.9  4.0 - 10.5 K/uL   RBC 4.74  4.22 - 5.81 MIL/uL   Hemoglobin 14.2  13.0 - 17.0 g/dL   HCT 40.7  39.0 - 52.0 %   MCV 85.9  78.0 - 100.0 fL   MCH 30.0  26.0 - 34.0 pg   MCHC 34.9  30.0 - 36.0 g/dL   RDW 13.5  11.5 - 15.5 %   Platelets 272  150 - 400 K/uL  LIPID PANEL     Status: None   Collection Time    06/16/13  4:15 AM      Result Value Range   Cholesterol 141  0 - 200 mg/dL   Triglycerides 102  <150 mg/dL   HDL 59  >39 mg/dL   Total CHOL/HDL Ratio 2.4     VLDL 20  0 - 40 mg/dL   LDL Cholesterol 62  0 - 99 mg/dL   Comment:            Total Cholesterol/HDL:CHD Risk     Coronary Heart Disease Risk Table                         Men   Women      1/2 Average Risk   3.4   3.3      Average Risk       5.0   4.4      2 X Average Risk   9.6   7.1      3 X Average Risk  23.4   11.0                Use the calculated Patient Ratio     above and the CHD Risk Table     to determine the patient's CHD Risk.                ATP III CLASSIFICATION (LDL):      <100     mg/dL   Optimal      100-129  mg/dL   Near or Above                        Optimal      130-159  mg/dL  Borderline      160-189  mg/dL   High      >190     mg/dL   Very High  BASIC METABOLIC PANEL     Status: None   Collection Time    06/16/13  4:15 AM      Result Value Range   Sodium 137  137 - 147 mEq/L   Potassium 4.0  3.7 - 5.3 mEq/L   Chloride 98  96 - 112 mEq/L   CO2 24  19 - 32 mEq/L   Glucose, Bld 95  70 - 99 mg/dL   BUN 10  6 - 23 mg/dL   Creatinine, Ser 0.84  0.50 - 1.35 mg/dL   Calcium 9.1  8.4 -  10.5 mg/dL   GFR calc non Af Amer >90  >90 mL/min   GFR calc Af Amer >90  >90 mL/min   Comment: (NOTE)     The eGFR has been calculated using the CKD EPI equation.     This calculation has not been validated in all clinical situations.     eGFR's persistently <90 mL/min signify possible Chronic Kidney     Disease.    Imaging: No results found.  Assessment:  Principal Problem:   Acute ST elevation myocardial infarction (STEMI) involving left anterior descending coronary artery Active Problems:   Chest pain   History of hidradenitis suppurativa   Amphetamine adverse reaction   Plan:  1. [name] is doing well. Ambulated without difficulty today. Will transfer to floor today. Anticipate d/[name] tomorrow given the extent of his MI. Echo showed EF 40-45% with anterior WMA.  Continue statin, although lipid profile is benign. He is on ASA, Brillinta, imdur, and lopressor.  No BP room to add ACE-I or ARB at this time. Good cardiac rehab candidate.  Anticipate d/[name] tomorrow.  Time Spent Directly with Patient:  15 minutes  Length of Stay:  LOS: 2 days   Pixie Casino, MD, [facility] Attending Cardiologist [facility]  [name] 06/16/2013, 8:33 AM

## 2013-06-16 NOTE — Progress Notes (Signed)
[name] has been enrolled in the Med-to-Bed Pilot Program for STEMI patients.  This program is designed to help patients transition out of the hospital by ensuring that patients have the opportunity to obtain medications and education on each medication.  The medications will be processed through the patient's insurance and delivered to the bedside prior to discharge.    The medications included are P2Y12 inhibitors, statins, beta-blockers, and aspirin.    When planning for discharge, please send additional prescriptions for the above medications to the [facility] (will be added to the patient's pharmacy list).    Please feel free to page me with questions or concerns!  Thank you, Piedad ClimesKelley Miller, PharmD Clinical Pharmacist - Resident Pager: [phone] Pharmacy: [phone] 06/16/2013 4:25 PM

## 2013-06-17 LAB — CBC
HEMATOCRIT: 40 % (ref 39.0–52.0)
HEMOGLOBIN: 13.8 g/dL (ref 13.0–17.0)
MCH: 29.5 pg (ref 26.0–34.0)
MCHC: 34.5 g/dL (ref 30.0–36.0)
MCV: 85.5 fL (ref 78.0–100.0)
Platelets: 255 10*3/uL (ref 150–400)
RBC: 4.68 MIL/uL (ref 4.22–5.81)
RDW: 13.3 % (ref 11.5–15.5)
WBC: 8.6 10*3/uL (ref 4.0–10.5)

## 2013-06-17 MED ORDER — NITROGLYCERIN 0.4 MG SL SUBL: 0.4000 mg | SUBLINGUAL_TABLET | SUBLINGUAL | Status: DC | PRN

## 2013-06-17 MED ORDER — ATORVASTATIN CALCIUM 40 MG PO TABS: 40.0000 mg | ORAL_TABLET | Freq: Every day | ORAL | Status: DC

## 2013-06-17 MED ORDER — PRASUGREL HCL 10 MG PO TABS: 10.0000 mg | ORAL_TABLET | Freq: Every day | ORAL | Status: DC

## 2013-06-17 MED ORDER — METOPROLOL TARTRATE 12.5 MG HALF TABLET
12.5000 mg | ORAL_TABLET | Freq: Two times a day (BID) | ORAL | Status: DC
  Administered 2013-06-17: 12.5 mg via ORAL
  Filled 2013-06-17 (×2): qty 1

## 2013-06-17 MED ORDER — ISOSORBIDE MONONITRATE ER 30 MG PO TB24: 15.0000 mg | ORAL_TABLET | Freq: Every day | ORAL | Status: DC

## 2013-06-17 MED ORDER — METOPROLOL TARTRATE 25 MG PO TABS
12.5000 mg | ORAL_TABLET | Freq: Two times a day (BID) | ORAL | Status: DC
Start: 2013-06-17 — End: 2013-07-28

## 2013-06-17 MED ORDER — ASPIRIN 81 MG PO TBEC
81.0000 mg | DELAYED_RELEASE_TABLET | Freq: Every day | ORAL | Status: AC
Start: 2013-06-17 — End: ?

## 2013-06-17 NOTE — Discharge Summary (Signed)
Physician Discharge Summary  Patient ID: [name] MRN: [id number] DOB/AGE: 19-Apr-1986 28 y.o.  Cardiologist: [place] PCP: No primary provider on file.   Admit date: 06/14/2013 Discharge date: 06/17/2013  Admission Diagnoses:  Chest Pain  Discharge Diagnoses:  Principal Problem:   NSTEMI (non-ST elevated myocardial infarction) Active Problems:   Chest pain   History of hidradenitis suppurativa   Amphetamine adverse reaction   Discharged Condition: stable  Hospital Course:  The patient is a 28 year old male with no past medical history. He went to the ED with sudden onset central chest pain after playing basketball this evening. He had no difficulty playing moderately vigorous basketball this evening. After stretching and cooling down he started to have a painful sensation in the chest. There was some increase in the pain with deep breathing. There wasn't any associated nausea or vomiting. There is no pain when swallowing. The patient felt that he could not get comfortable. The pain was worse with movement of the arms or chest. The pain was worse if he were to sit upright or lie flat on his back. He went home and tried to shower and eat but this did not improve the symptoms. He felt a cold sensation in his body. He decided to go to the ED after the symptoms had lasted about 90 minutes. In the ED he got nitroglycerin which improved the pain but lowered his blood pressure. ECG did not show any ischemic changes. His blood pressure is equal in both arms. He was not hypoxic or tachycardic. His d-dimer was negative and his troponin was 0.35. He was transferred here for further treatment. He felt that being jostled around on the ride over made the pain worse. He cannot recall having these symptoms before. There is no history of premature CAD or sudden cardiac death. He is a non-smoker. He doesn't exercise regularly, but has been trying to exercise more over the past month. He has never had  exertional chest pain or shortness of breath. He doesn't recall any swelling in his leg. He does recall having a viral illness about 14 days ago that consisted of a sore throat. He denies fevers or chills.   The patient was admitted and started on IV heparin and NTG drip.  Troponin peaked >20.0. He continued to have CP and was taken to the cath lab where left heart cath revealed severe one vessel CAD with an occluded mid LAD thought to be plaque rupture.  No other disease noted.  Moderately elevated left vent. end diastolic pressure.  He underwent Successful aspiration thrombectomy and drug-eluting stent placement x2 to the mid LAD. A second stent was placed due to suspected edge dissection.  He was started on effient.  2D echo revealed EF of 40-45%, akinesis of the mid-distalanteroseptal and apical myocardium and grade one diastolic dysfuntion. UDS was positive for amphetamines and opiates.  BP was too low for addition of ACE-I or ARB.  The patient ambulated without difficulty prior to discharge.   He was seen by Dr. Rennis Golden who felt he was stable for DC home.   Consults: Cardiac rehab  Significant Diagnostic Studies: Left heart cath  Procedural Findings:  Hemodynamics:  AO: 117/85 mmHg  LV: 113/7 mmHg  LVEDP: 20 mmHg  Coronary angiography:  Coronary dominance: Right  Left Main: The left main coronary artery has angulated takeoff from the aorta but no evidence of atherosclerosis.  Left Anterior Descending (LAD): Large in size and occluded in the midsegment with TIMI 0  flow. Large thrombus is noted.  1st diagonal (D1): Small in size with no significant disease.  2nd diagonal (D2): Small in size with no significant disease.  3rd diagonal (D3): Small in size with no significant disease.  Circumflex (LCx): Normal in size and nondominant. The vessel has no significant disease. Right Coronary Artery: Large in size and dominant. The vessel has no significant disease.  Posterior descending artery:  Normal in size with no significant disease.  Posterior AV segment: Normal in size with no significant disease.  Posterolateral branchs: PL 1 is large in size with no significant disease. PL 2 is normal in size with no significant disease. Left ventriculography: Was not performed as the patient had an echocardiogram this morning.  PCI Note: Following the diagnostic procedure, the decision was made to proceed with PCI. Weight-based bivalirudin was given for anticoagulation. Once a therapeutic ACT was achieved, a 6 [place] XB LAD 3.5 guide catheter was inserted. A run through coronary guidewire was used to cross the lesion. Aspiration nephrectomy was performed x2 with retrieval of large amount of thrombus. The lesion was then stented with a 3.25 x 18 mm Xience drug-eluting stent. Following PCI, there was 0% residual stenosis and TIMI-3 flow. A small residual thrombus was noted which was not affecting the blood flow. I elected to start treatment with Tirofiban. I was satisfied with the results. The wire and guiding catheter were removed. However, the patient had recurrent chest and arm discomfort similar to his initial presentation. Thus, I decided to reimage his coronary arteries to exclude embolization. The RCA appeared unchanged. In the LAD, the area distal to the stent looked hazy and suggestive of distal edge dissection. This improved with intracoronary nitroglycerin but did not resolve. Intravascular ultrasound was performed which showed the stent was well opposed. No convincing evidence of edge dissection was noted. However, the area appeared narrowed distal to the stent. Due to that, I decided to treat this area with an overlapping stent 2.75 x 18 mm Xience drug-eluting stent. The overlap area was post dilated with a 3.25 x 20 noncompliant balloon to 16 atmospheres. Final angiography confirmed an excellent result. The patient tolerated the procedure well. There were no immediate procedural complications. A TR  band was used for radial hemostasis. The patient was transferred to the post catheterization recovery area for further monitoring.  PCI Data:  Vessel - LAD/Segment - mid  Percent Stenosis (pre) 100%  TIMI-flow 0  Stent 3.25 x 18 mm overlap with a 2.75 x 18 mm Xience drug-eluting stents  Percent Stenosis (post) 0%  TIMI-flow (post) 3  Final Conclusions:  1. Severe one-vessel coronary artery disease with occluded mid LAD due to what seems to be a plaque rupture. No other obstructive disease is noted.  2. Moderately elevated left ventricular end-diastolic pressure. Mild to moderately reduced LV systolic function by echocardiogram with an EF of 40-45%  3. Successful aspiration thrombectomy and drug-eluting stent placement x2 to the mid LAD. A second stent was placed due to suspected edge dissection.  Recommendations:  Continue dual antiplatelet therapy for at least one year. Treat with tirofiban for 18 hours.  Lorine BearsMuhammad Arida MD, [facility]  06/14/2013, 11:01 AM   Echo Tressie Ellis*[facility]* *[facility]* [address] [place], [place] [address] [phone]  ------------------------------------------------------------ Transthoracic Echocardiography  ------------------------------------------------------------ LV EF: 40% - 45%  ------------------------------------------------------------ Indications: Chest pain 786.51.  ------------------------------------------------------------ Study Conclusions  - Left ventricle: The cavity size was normal. Systolic function was mildly to moderately reduced. The  estimated ejection fraction was in the range of 40% to 45%. There is akinesis of the mid-distalanteroseptal and apical myocardium. Doppler parameters are consistent with abnormal left ventricular relaxation (grade 1 diastolic dysfunction). - Aortic valve: A bicuspid morphology cannot be excluded. Valve area: 2cm^2(VTI). Valve area: 2.1cm^2 (Vmax).  Lipid Panel     Component  Value Date/Time   CHOL 141 06/16/2013 0415   TRIG 102 06/16/2013 0415   HDL 59 06/16/2013 0415   CHOLHDL 2.4 06/16/2013 0415   VLDL 20 06/16/2013 0415   LDLCALC 62 06/16/2013 0415    Treatments: See above  Discharge Exam: Blood pressure 97/56, pulse 73, temperature 98.2 F (36.8 C), temperature source Oral, resp. rate 21, height 5\' 7"  (1.702 m), weight 178 lb 5.6 oz (80.9 kg), SpO2 99.00%.   Disposition:       Discharge Orders   Future Appointments Provider Department Dept Phone   07/02/2013 10:45 AM Chrystie Nose, MD [facility] [phone]   Future Orders Complete By Expires   Amb Referral to Cardiac Rehabilitation  As directed    Comments:     To [place] as well   Diet - low sodium heart healthy  As directed    Discharge instructions  As directed    Comments:     Work Note:    The patient should not return to work until seen by the Cardiologist at his follow up appointment.   Increase activity slowly  As directed        Medication List         aspirin 81 MG EC tablet  Take 1 tablet (81 mg total) by mouth daily.     atorvastatin 40 MG tablet  Commonly known as:  LIPITOR  Take 1 tablet (40 mg total) by mouth daily at 6 PM.     isosorbide mononitrate 30 MG 24 hr tablet  Commonly known as:  IMDUR  Take 0.5 tablets (15 mg total) by mouth daily.     metoprolol tartrate 25 MG tablet  Commonly known as:  LOPRESSOR  Take 0.5 tablets (12.5 mg total) by mouth 2 (two) times daily.     nitroGLYCERIN 0.4 MG SL tablet  Commonly known as:  NITROSTAT  Place 1 tablet (0.4 mg total) under the tongue every 5 (five) minutes as needed for chest pain.     prasugrel 10 MG Tabs tablet  Commonly known as:  EFFIENT  Take 1 tablet (10 mg total) by mouth daily.       Follow-up Information   Follow up with Chrystie Nose, MD. (Our office will call you with the appt date and time.)    Specialty:  Cardiology   Contact information:   [address] [place]  [address] [place] [address] [phone]       Signed: Wilburt Finlay 06/17/2013, 9:30 AM

## 2013-06-17 NOTE — Progress Notes (Addendum)
    DAILY PROGRESS NOTE  Subjective:  No further chest pain. Ambulated without difficulty yesterday. Feels well and wants to go home. Not transferred yesterday due to lack of bed availability.  Objective:  Temp:  [98.1 F (36.7 C)-99.3 F (37.4 C)] 98.2 F (36.8 C) (01/28 0735) Resp:  [20-21] 21 (01/28 0735) BP: (87-101)/(40-63) 97/56 mmHg (01/28 0735) SpO2:  [97 %-99 %] 99 % (01/28 0735) Weight change:   Intake/Output from previous day: 01/27 0701 - 01/28 0700 In: 1040 [P.O.:1040] Out: -   Intake/Output from this shift:    Medications: Current Facility-Administered Medications  Medication Dose Route Frequency Provider Last Rate Last Dose  . acetaminophen (TYLENOL) tablet 650 mg  650 mg Oral Q4H PRN Luke Kohan, MD      . aspirin EC tablet 81 mg  81 mg Oral Daily Luke Kohan, MD   81 mg at 06/16/13 0926  . atorvastatin (LIPITOR) tablet 40 mg  40 mg Oral q1800 Luke Kohan, MD   40 mg at 06/16/13 1623  . isosorbide mononitrate (IMDUR) 24 hr tablet 15 mg  15 mg Oral Daily Kenneth C. Hilty, MD   15 mg at 06/16/13 0926  . metoprolol tartrate (LOPRESSOR) tablet 25 mg  25 mg Oral BID Mark Skains, MD   25 mg at 06/16/13 2147  . morphine 4 MG/ML injection 4 mg  4 mg Intravenous Q1H PRN Luke Kohan, MD   4 mg at 06/15/13 0533  . nitroGLYCERIN (NITROSTAT) SL tablet 0.4 mg  0.4 mg Sublingual Q5 min PRN John L Molpus, MD   0.4 mg at 06/14/13 0149  . ondansetron (ZOFRAN) injection 4 mg  4 mg Intravenous Q6H PRN Luke Kohan, MD   4 mg at 06/14/13 1259  . prasugrel (EFFIENT) tablet 10 mg  10 mg Oral Daily Mark Skains, MD   10 mg at 06/16/13 0926    Physical Exam: General appearance: alert and no distress Neck: no adenopathy, no carotid bruit and no JVD Lungs: clear to auscultation bilaterally Heart: regular rate and rhythm, S1, S2 normal, no murmur, click, rub or gallop Abdomen: soft, non-tender; bowel sounds normal; no masses,  no organomegaly Extremities: extremities normal,  atraumatic, no cyanosis or edema Pulses: 2+ and symmetric radial cath site is without ecchymosis or bruit Skin: Skin color, texture, turgor normal. No rashes or lesions Neurologic: Alert and oriented X 3, normal strength and tone. Normal symmetric reflexes. Normal coordination and gait Psych: Normal mood, affect  Lab Results: Results for orders placed during the hospital encounter of 06/14/13 (from the past 48 hour(s))  TROPONIN I     Status: Abnormal   Collection Time    06/15/13 10:20 AM      Result Value Range   Troponin I >20.00 (*) <0.30 ng/mL   Comment: CRITICAL VALUE NOTED.  VALUE IS CONSISTENT WITH PREVIOUSLY REPORTED AND CALLED VALUE.  TSH     Status: None   Collection Time    06/15/13 10:20 AM      Result Value Range   TSH 0.626  0.350 - 4.500 uIU/mL   Comment: Performed at [facility]  TROPONIN I     Status: Abnormal   Collection Time    06/15/13  5:15 PM      Result Value Range   Troponin I >20.00 (*) <0.30 ng/mL   Comment:            Due to the release kinetics of cTnI,     a negative result   within the first hours     of the onset of symptoms does not rule out     myocardial infarction with certainty.     If myocardial infarction is still suspected,     repeat the test at appropriate intervals.     CRITICAL VALUE NOTED.  VALUE IS CONSISTENT WITH PREVIOUSLY REPORTED AND CALLED VALUE.  CBC     Status: None   Collection Time    06/16/13  4:15 AM      Result Value Range   WBC 9.9  4.0 - 10.5 K/uL   RBC 4.74  4.22 - 5.81 MIL/uL   Hemoglobin 14.2  13.0 - 17.0 g/dL   HCT 40.7  39.0 - 52.0 %   MCV 85.9  78.0 - 100.0 fL   MCH 30.0  26.0 - 34.0 pg   MCHC 34.9  30.0 - 36.0 g/dL   RDW 13.5  11.5 - 15.5 %   Platelets 272  150 - 400 K/uL  LIPID PANEL     Status: None   Collection Time    06/16/13  4:15 AM      Result Value Range   Cholesterol 141  0 - 200 mg/dL   Triglycerides 102  <150 mg/dL   HDL 59  >39 mg/dL   Total CHOL/HDL Ratio 2.4     VLDL 20  0  - 40 mg/dL   LDL Cholesterol 62  0 - 99 mg/dL   Comment:            Total Cholesterol/HDL:CHD Risk     Coronary Heart Disease Risk Table                         Men   Women      1/2 Average Risk   3.4   3.3      Average Risk       5.0   4.4      2 X Average Risk   9.6   7.1      3 X Average Risk  23.4   11.0                Use the calculated Patient Ratio     above and the CHD Risk Table     to determine the patient's CHD Risk.                ATP III CLASSIFICATION (LDL):      <100     mg/dL   Optimal      100-129  mg/dL   Near or Above                        Optimal      130-159  mg/dL   Borderline      160-189  mg/dL   High      >190     mg/dL   Very High  BASIC METABOLIC PANEL     Status: None   Collection Time    06/16/13  4:15 AM      Result Value Range   Sodium 137  137 - 147 mEq/L   Potassium 4.0  3.7 - 5.3 mEq/L   Chloride 98  96 - 112 mEq/L   CO2 24  19 - 32 mEq/L   Glucose, Bld 95  70 - 99 mg/dL   BUN 10  6 - 23 mg/dL   Creatinine, Ser 0.84  0.50 - 1.35 mg/dL   Calcium 9.1  8.4 - 10.5 mg/dL   GFR calc non Af Amer >90  >90 mL/min   GFR calc Af Amer >90  >90 mL/min   Comment: (NOTE)     The eGFR has been calculated using the CKD EPI equation.     This calculation has not been validated in all clinical situations.     eGFR's persistently <90 mL/min signify possible Chronic Kidney     Disease.  CBC     Status: None   Collection Time    06/17/13  3:23 AM      Result Value Range   WBC 8.6  4.0 - 10.5 K/uL   RBC 4.68  4.22 - 5.81 MIL/uL   Hemoglobin 13.8  13.0 - 17.0 g/dL   HCT 40.0  39.0 - 52.0 %   MCV 85.5  78.0 - 100.0 fL   MCH 29.5  26.0 - 34.0 pg   MCHC 34.5  30.0 - 36.0 g/dL   RDW 13.3  11.5 - 15.5 %   Platelets 255  150 - 400 K/uL    Imaging: No results found.  Assessment:  Principal Problem:   Acute ST elevation myocardial infarction (STEMI) involving left anterior descending coronary artery Active Problems:   Chest pain   History of  hidradenitis suppurativa   Amphetamine adverse reaction   Plan:  Mr. Loy is doing well.  Blood pressure is getting low at night into the 80's systolic. I will decrease the lopressor to 12.5 mg BID.  No bp room to add ACE-I or ARB at this time.  Ok for discharge home today.  I would be happy to see him in follow-up in the office in 1-2 weeks at [facility].  Will need a work excuse until follow-up appointment in the office.   Time Spent Directly with Patient:  15 minutes  Length of Stay:  LOS: 3 days   Kenneth C. Hilty, MD, FACC Attending Cardiologist [facility]  HILTY,Kenneth C 06/17/2013, 8:32 AM       

## 2013-06-17 NOTE — Progress Notes (Signed)
Pt discharge instruction complete. Pt verbalized acknowledgment of teaching; pt aware of how to take SL NTG tabs and when to call 911.  Pt & father have no further questions or comments; D/c PIVs, removed from heart monitor, Elink notified and pt wheeled out to father's vehicle; emotional support given to pt & to father;

## 2013-06-30 ENCOUNTER — Encounter: Payer: Self-pay | Admitting: *Deleted

## 2013-06-30 ENCOUNTER — Ambulatory Visit (INDEPENDENT_AMBULATORY_CARE_PROVIDER_SITE_OTHER): Payer: BC Managed Care – PPO | Admitting: Cardiovascular Disease

## 2013-06-30 ENCOUNTER — Encounter (INDEPENDENT_AMBULATORY_CARE_PROVIDER_SITE_OTHER): Payer: BC Managed Care – PPO

## 2013-06-30 ENCOUNTER — Encounter: Payer: Self-pay | Admitting: Cardiovascular Disease

## 2013-06-30 VITALS — BP 105/50 | HR 82 | Ht 67.0 in | Wt 177.0 lb

## 2013-06-30 DIAGNOSIS — R002 Palpitations: Secondary | ICD-10-CM

## 2013-06-30 DIAGNOSIS — Z79899 Other long term (current) drug therapy: Secondary | ICD-10-CM

## 2013-06-30 DIAGNOSIS — I2589 Other forms of chronic ischemic heart disease: Secondary | ICD-10-CM

## 2013-06-30 DIAGNOSIS — R42 Dizziness and giddiness: Secondary | ICD-10-CM

## 2013-06-30 DIAGNOSIS — R079 Chest pain, unspecified: Secondary | ICD-10-CM

## 2013-06-30 DIAGNOSIS — I255 Ischemic cardiomyopathy: Secondary | ICD-10-CM

## 2013-06-30 DIAGNOSIS — E785 Hyperlipidemia, unspecified: Secondary | ICD-10-CM

## 2013-06-30 DIAGNOSIS — I2510 Atherosclerotic heart disease of native coronary artery without angina pectoris: Secondary | ICD-10-CM

## 2013-06-30 LAB — CBC WITH DIFFERENTIAL/PLATELET
Basophils Absolute: 0.1 10*3/uL (ref 0.0–0.1)
Basophils Relative: 1 % (ref 0–1)
Eosinophils Absolute: 0.4 10*3/uL (ref 0.0–0.7)
Eosinophils Relative: 4 % (ref 0–5)
HCT: 42.7 % (ref 39.0–52.0)
HEMOGLOBIN: 14.7 g/dL (ref 13.0–17.0)
Lymphocytes Relative: 23 % (ref 12–46)
Lymphs Abs: 2.4 10*3/uL (ref 0.7–4.0)
MCH: 29.3 pg (ref 26.0–34.0)
MCHC: 34.4 g/dL (ref 30.0–36.0)
MCV: 85.2 fL (ref 78.0–100.0)
MONOS PCT: 9 % (ref 3–12)
Monocytes Absolute: 1 10*3/uL (ref 0.1–1.0)
Neutro Abs: 6.7 10*3/uL (ref 1.7–7.7)
Neutrophils Relative %: 64 % (ref 43–77)
PLATELETS: 284 10*3/uL (ref 150–400)
RBC: 5.01 MIL/uL (ref 4.22–5.81)
RDW: 13 % (ref 11.5–15.5)
WBC: 10.4 10*3/uL (ref 4.0–10.5)

## 2013-06-30 LAB — BASIC METABOLIC PANEL
BUN: 12 mg/dL (ref 6–23)
CHLORIDE: 101 meq/L (ref 96–112)
CO2: 25 mEq/L (ref 19–32)
CREATININE: 0.8 mg/dL (ref 0.50–1.35)
Calcium: 9.6 mg/dL (ref 8.4–10.5)
GLUCOSE: 112 mg/dL — AB (ref 70–99)
Potassium: 4.4 mEq/L (ref 3.5–5.3)
Sodium: 140 mEq/L (ref 135–145)

## 2013-06-30 NOTE — Progress Notes (Signed)
HPI  The patient is a 28 year old male who is here for a follow up visit after a recent hospitalization for anterior STEMI. He has no previous chronic medical conditions. He went to the ED on 1/25 with sudden onset central chest pain after playing basketball .  After stretching and cooling down he started to have a painful sensation in the chest. There was some increase in the pain with deep breathing. There wasn't any associated nausea or vomiting. There is no pain when swallowing. The patient felt that he could not get comfortable. The pain was worse with movement of the arms or chest. The pain was worse if he were to sit upright or lie flat on his back. He went home and tried to shower and eat but this did not improve the symptoms. He felt a cold sensation in his body. He decided to go to the ED after the symptoms had lasted about 90 minutes. In the ED he got nitroglycerin which improved the pain but lowered his blood pressure. ECG did not show any ischemic changes. His d-dimer was negative and his troponin was 0.35.  No family history of CAD.  He is a non-smoker. The patient was admitted and started on IV heparin and NTG drip. He continued to have CP. A stat echo was performed given his atypical presentation. It showed no pericardial effusion with anteroapical akinesis.  He was taken to the cath lab urgently where left heart cath revealed severe one vessel CAD with an occluded mid LAD thought to be plaque rupture. No other disease noted. Moderately elevated left vent. end diastolic pressure. He underwent Successful aspiration thrombectomy and drug-eluting stent placement x2 to the mid LAD. A second stent was placed due to suspected edge dissection versus spasm.  2D echo revealed EF of 40-45%, akinesis of the mid-distalanteroseptal and apical myocardium and grade one diastolic dysfuntion.  UDS was positive for amphetamines . He reported taking energy pills from a friend. BP was too low for addition of  ACE-I or ARB.  Since hospital discharge, he has been doing reasonably well and denies chest tightness. He is having chest pain only with deep breath and when he lies down. He asked he feels better with exertion. He reports having palpitations and tachycardia about 1 week ago which was intermittent for 2 days. No significant shortness of breath.  Allergies  Allergen Reactions  . Penicillins Other (See Comments)    Childhood reaction     Current Outpatient Prescriptions on File Prior to Visit  Medication Sig Dispense Refill  . aspirin EC 81 MG EC tablet Take 1 tablet (81 mg total) by mouth daily.      Marland Kitchen atorvastatin (LIPITOR) 40 MG tablet Take 1 tablet (40 mg total) by mouth daily at 6 PM.  30 tablet  5  . isosorbide mononitrate (IMDUR) 30 MG 24 hr tablet Take 0.5 tablets (15 mg total) by mouth daily.  30 tablet  5  . metoprolol tartrate (LOPRESSOR) 25 MG tablet Take 0.5 tablets (12.5 mg total) by mouth 2 (two) times daily.  60 tablet  5  . nitroGLYCERIN (NITROSTAT) 0.4 MG SL tablet Place 1 tablet (0.4 mg total) under the tongue every 5 (five) minutes as needed for chest pain.  25 tablet  12  . prasugrel (EFFIENT) 10 MG TABS tablet Take 1 tablet (10 mg total) by mouth daily.  30 tablet  11   No current facility-administered medications on file prior to visit.  No past medical history on file.   No past surgical history on file.   No family history on file.   History   Social History  . Marital Status: Single    Spouse Name: N/A    Number of Children: N/A  . Years of Education: N/A   Occupational History  . Not on file.   Social History Main Topics  . Smoking status: Never Smoker   . Smokeless tobacco: Not on file  . Alcohol Use: Yes  . Drug Use: No  . Sexual Activity: Yes   Other Topics Concern  . Not on file   Social History Narrative  . No narrative on file     ROS   PHYSICAL EXAM   BP 105/50  Pulse 82  Ht 5\' 7"  (1.702 m)  Wt 177 lb (80.287 kg)   BMI 27.72 kg/m2 Constitutional: He is oriented to person, place, and time. He appears well-developed and well-nourished. No distress.  HENT: No nasal discharge.  Head: Normocephalic and atraumatic.  Eyes: Pupils are equal and round.  No discharge. Neck: Normal range of motion. Neck supple. No JVD present. No thyromegaly present.  Cardiovascular: Normal rate, regular rhythm, normal heart sounds. Exam reveals no gallop and no friction rub. No murmur heard.  Pulmonary/Chest: Effort normal and breath sounds normal. No stridor. No respiratory distress. He has no wheezes. He has no rales. He exhibits no tenderness.  Abdominal: Soft. Bowel sounds are normal. He exhibits no distension. There is no tenderness. There is no rebound and no guarding.  Musculoskeletal: Normal range of motion. He exhibits no edema and no tenderness.  Neurological: He is alert and oriented to person, place, and time. Coordination normal.  Skin: Skin is warm and dry. No rash noted. He is not diaphoretic. No erythema. No pallor.  Psychiatric: He has a normal mood and affect. His behavior is normal. Judgment and thought content normal.       EKG: Normal sinus rhythm with evolving anterior MI.   ASSESSMENT AND PLAN

## 2013-06-30 NOTE — Progress Notes (Signed)
Patient ID: [name], male   DOB: 12/06/1985, 28 y.o.   MRN: [id number] E-Cardio 48 hour holter monitor applied to patient.

## 2013-06-30 NOTE — Patient Instructions (Signed)
Your physician has requested that you have an echocardiogram. Echocardiography is a painless test that uses sound waves to create images of your heart. It provides your doctor with information about the size and shape of your heart and how well your heart's chambers and valves are working. This procedure takes approximately one hour. There are no restrictions for this procedure.  Your physician has recommended that you wear a holter monitor. Holter monitors are medical devices that record the heart's electrical activity. Doctors most often use these monitors to diagnose arrhythmias. Arrhythmias are problems with the speed or rhythm of the heartbeat. The monitor is a small, portable device. You can wear one while you do your normal daily activities. This is usually used to diagnose what is causing palpitations/syncope (passing out).  Your physician recommends that you return for lab work in: 1 month on day of your next appointment (FASTING LIPIDS AND LIVER PROFILE)  Your physician recommends that you schedule a follow-up appointment in: 1 Month with Dr. Kirke CorinArida.

## 2013-07-02 ENCOUNTER — Ambulatory Visit: Payer: BC Managed Care – PPO | Admitting: Internal Medicine

## 2013-07-03 ENCOUNTER — Other Ambulatory Visit (HOSPITAL_COMMUNITY): Payer: BC Managed Care – PPO

## 2013-07-03 ENCOUNTER — Encounter: Payer: Self-pay | Admitting: Cardiovascular Disease

## 2013-07-03 ENCOUNTER — Ambulatory Visit (HOSPITAL_COMMUNITY)
Admission: RE | Admit: 2013-07-03 | Discharge: 2013-07-03 | Disposition: A | Discharge status: Home / Self Care | Payer: BC Managed Care – PPO | Source: Ambulatory Visit | Attending: Cardiovascular Disease | Admitting: Cardiovascular Disease

## 2013-07-03 DIAGNOSIS — R072 Precordial pain: Secondary | ICD-10-CM

## 2013-07-03 DIAGNOSIS — I252 Old myocardial infarction: Secondary | ICD-10-CM | POA: Insufficient documentation

## 2013-07-03 DIAGNOSIS — R42 Dizziness and giddiness: Secondary | ICD-10-CM

## 2013-07-03 DIAGNOSIS — I2510 Atherosclerotic heart disease of native coronary artery without angina pectoris: Secondary | ICD-10-CM | POA: Insufficient documentation

## 2013-07-03 DIAGNOSIS — R079 Chest pain, unspecified: Secondary | ICD-10-CM

## 2013-07-03 DIAGNOSIS — R002 Palpitations: Secondary | ICD-10-CM | POA: Insufficient documentation

## 2013-07-03 DIAGNOSIS — Z79899 Other long term (current) drug therapy: Secondary | ICD-10-CM

## 2013-07-03 DIAGNOSIS — I255 Ischemic cardiomyopathy: Secondary | ICD-10-CM | POA: Insufficient documentation

## 2013-07-03 DIAGNOSIS — E785 Hyperlipidemia, unspecified: Secondary | ICD-10-CM | POA: Insufficient documentation

## 2013-07-03 NOTE — Progress Notes (Signed)
  Echocardiogram 2D Echocardiogram has been performed.  [name] FRANCES 07/03/2013, 2:57 PM

## 2013-07-03 NOTE — Assessment & Plan Note (Signed)
Continue treatment with atorvastatin. A followup lipid and liver profile will be needed in the near future.

## 2013-07-03 NOTE — Assessment & Plan Note (Signed)
Continue small dose metoprolol and isosorbide. Not able to have an ACE inhibitor due to low blood pressure.

## 2013-07-03 NOTE — Assessment & Plan Note (Signed)
It is highly possible that his recent anterior myocardial infarction was induced by the use of amphetamine which is known to cause coronary spasm as well as inflammation. He otherwise has no risk factors. He is having pleuritic type chest pain. I think it's important to ensure no post MI pericarditis. Thus, I will obtain an echocardiogram for evaluation. Continue dual antiplatelet therapy for at least 12 months. Start cardiac rehabilitation.

## 2013-07-03 NOTE — Assessment & Plan Note (Signed)
This is concerning given his recent myocardial infarction. I recommend a 48 hour Holter monitor. While the patient was having a Holter monitor placed, he had extreme dizziness and became pale. His blood pressure dropped to 80/50. With repeated his EKG which was unchanged. It appears that he had a vagal response. He did not have syncope. I requested routine labs just to ensure no significant anemia or volume depletion.

## 2013-07-09 ENCOUNTER — Telehealth: Payer: Self-pay | Admitting: Cardiovascular Disease

## 2013-07-09 ENCOUNTER — Telehealth: Payer: Self-pay | Admitting: Internal Medicine

## 2013-07-09 NOTE — Telephone Encounter (Signed)
See previous telephone note from today ( 07/09/13) Mylo Redebbie Jeiry Birnbaum RN

## 2013-07-09 NOTE — Telephone Encounter (Signed)
lmtcb

## 2013-07-09 NOTE — Telephone Encounter (Signed)
This is a [facility] patient.  Message forwarded to [facility] [address] Triage.

## 2013-07-09 NOTE — Telephone Encounter (Signed)
Following up on a referral for Cardiac Rehab. Need this asap.

## 2013-07-09 NOTE — Telephone Encounter (Signed)
New message     Returning a nurses call regarding this pt.

## 2013-07-09 NOTE — Telephone Encounter (Signed)
LMOVM   referral to cardiac rehab was cancelled. Did not see where this was ordered for [facility].  Will forward to Dr. Kirke CorinArida to see if he indeed wanted the pt to participate in cardiac rehab at [facility] Debbie Donyea Gafford RN

## 2013-07-09 NOTE — Telephone Encounter (Signed)
Left message on Jasmin voicemail to start Cardiac Rehab Mylo Redebbie Mekenzie Modeste RN

## 2013-07-09 NOTE — Telephone Encounter (Signed)
Yes. Start cardiac rehab ASAP.

## 2013-07-10 ENCOUNTER — Telehealth: Payer: Self-pay | Admitting: *Deleted

## 2013-07-10 NOTE — Telephone Encounter (Signed)
Faxed cardiac rehab physician referral form for phase 2 cardiac rehab at [facility]

## 2013-07-14 ENCOUNTER — Telehealth: Payer: Self-pay | Admitting: *Deleted

## 2013-07-14 NOTE — Telephone Encounter (Signed)
Dr. Kirke CorinArida reviewed monitor report. --notes that patient is sinus rhythm with occasional PVCs, sinus tachycardia--increase metoprolol to 25 mg BID. (has been taking 12.5 mg BID)  Left detailed message on mobile voicemail and asked for call back to verify he received/undertands the message.

## 2013-07-17 NOTE — Telephone Encounter (Signed)
Patient taking increased metoprolol as ordered.  Feels well most days, esp last week or 2.  Has cardiac rehab eval on Monday.  He would like to know when he will be released to return to work.

## 2013-07-17 NOTE — Telephone Encounter (Signed)
Follow up  ° ° ° °Returning call back to nurse  °

## 2013-07-20 NOTE — Telephone Encounter (Signed)
Informed patient.  He does not need a letter.  He will call back if he finds out otherwise.

## 2013-07-20 NOTE — Telephone Encounter (Signed)
He is good to resume work now. No restrictions.

## 2013-07-28 ENCOUNTER — Ambulatory Visit (INDEPENDENT_AMBULATORY_CARE_PROVIDER_SITE_OTHER): Payer: BC Managed Care – PPO | Admitting: Cardiovascular Disease

## 2013-07-28 ENCOUNTER — Encounter: Payer: Self-pay | Admitting: Cardiovascular Disease

## 2013-07-28 ENCOUNTER — Other Ambulatory Visit: Payer: BC Managed Care – PPO

## 2013-07-28 VITALS — BP 102/68 | HR 74 | Ht 67.0 in | Wt 174.0 lb

## 2013-07-28 DIAGNOSIS — I2589 Other forms of chronic ischemic heart disease: Secondary | ICD-10-CM

## 2013-07-28 DIAGNOSIS — Z79899 Other long term (current) drug therapy: Secondary | ICD-10-CM

## 2013-07-28 DIAGNOSIS — E785 Hyperlipidemia, unspecified: Secondary | ICD-10-CM

## 2013-07-28 DIAGNOSIS — I255 Ischemic cardiomyopathy: Secondary | ICD-10-CM

## 2013-07-28 DIAGNOSIS — I2510 Atherosclerotic heart disease of native coronary artery without angina pectoris: Secondary | ICD-10-CM

## 2013-07-28 DIAGNOSIS — Q231 Congenital insufficiency of aortic valve: Secondary | ICD-10-CM | POA: Insufficient documentation

## 2013-07-28 MED ORDER — LISINOPRIL 5 MG PO TABS: 5.0000 mg | ORAL_TABLET | Freq: Every day | ORAL | Status: DC

## 2013-07-28 NOTE — Assessment & Plan Note (Signed)
He is doing very well from a cardiac standpoint with no recurrent angina. Continue medical therapy. Continue dual antiplatelet therapy at least until January of 2016.

## 2013-07-28 NOTE — Assessment & Plan Note (Signed)
Ejection fraction was mildly reduced. I stopped the Imdur and added lisinopril 5 mg once daily. Check basic metabolic profile in one week.

## 2013-07-28 NOTE — Assessment & Plan Note (Signed)
This was noted on his most recent echocardiogram. There was no significant stenosis or regurgitation. Continue to monitor.

## 2013-07-28 NOTE — Progress Notes (Signed)
HPI  The patient is a 28 year old male who is here for a follow up visit regarding coronary artery disease. He went to the ED on 1/25 with sudden onset central chest pain after playing basketball .he was found to have anterior ST elevation myocardial infarction. Cardiac catheterization revealed severe one vessel CAD with an occluded mid LAD thought to be plaque rupture. No other disease noted. He underwent Successful aspiration thrombectomy and drug-eluting stent placement x2 to the mid LAD. A second stent was placed due to suspected edge dissection versus spasm.  2D echo revealed EF of 40-45%, akinesis of the mid-distalanteroseptal and apical myocardium and grade one diastolic dysfuntion.  UDS was positive for amphetamines . He reported taking energy pills from a friend.  He has been doing very well and denies any chest pain, dyspnea or palpitations. He started cardiac rehabilitation and has been doing very well. He reports no exertional symptoms. He resumed work.  Allergies  Allergen Reactions  . Penicillins Other (See Comments)    Childhood reaction     Current Outpatient Prescriptions on File Prior to Visit  Medication Sig Dispense Refill  . aspirin EC 81 MG EC tablet Take 1 tablet (81 mg total) by mouth daily.      . nitroGLYCERIN (NITROSTAT) 0.4 MG SL tablet Place 1 tablet (0.4 mg total) under the tongue every 5 (five) minutes as needed for chest pain.  25 tablet  12  . prasugrel (EFFIENT) 10 MG TABS tablet Take 1 tablet (10 mg total) by mouth daily.  30 tablet  11  . atorvastatin (LIPITOR) 40 MG tablet Take 1 tablet (40 mg total) by mouth daily at 6 PM.  30 tablet  5   No current facility-administered medications on file prior to visit.     Past Medical History  Diagnosis Date  . Hyperlipidemia   . Ischemic cardiomyopathy     Ejection fraction of 40-45% due to anterior myocardial infarction  . Coronary artery disease      Past Surgical History  Procedure Laterality Date    . Cardiac catheterization    . Coronary angioplasty       No family history on file.   History   Social History  . Marital Status: Single    Spouse Name: N/A    Number of Children: N/A  . Years of Education: N/A   Occupational History  . Not on file.   Social History Main Topics  . Smoking status: Never Smoker   . Smokeless tobacco: Not on file  . Alcohol Use: Yes  . Drug Use: No  . Sexual Activity: Yes   Other Topics Concern  . Not on file   Social History Narrative  . No narrative on file      PHYSICAL EXAM   BP 102/68  Pulse 74  Ht 5\' 7"  (1.702 m)  Wt 174 lb (78.926 kg)  BMI 27.25 kg/m2 Constitutional: He is oriented to person, place, and time. He appears well-developed and well-nourished. No distress.  HENT: No nasal discharge.  Head: Normocephalic and atraumatic.  Eyes: Pupils are equal and round.  No discharge. Neck: Normal range of motion. Neck supple. No JVD present. No thyromegaly present.  Cardiovascular: Normal rate, regular rhythm, normal heart sounds. Exam reveals no gallop and no friction rub. No murmur heard.  Pulmonary/Chest: Effort normal and breath sounds normal. No stridor. No respiratory distress. He has no wheezes. He has no rales. He exhibits no tenderness.  Abdominal: Soft.  Bowel sounds are normal. He exhibits no distension. There is no tenderness. There is no rebound and no guarding.  Musculoskeletal: Normal range of motion. He exhibits no edema and no tenderness.  Neurological: He is alert and oriented to person, place, and time. Coordination normal.  Skin: Skin is warm and dry. No rash noted. He is not diaphoretic. No erythema. No pallor.  Psychiatric: He has a normal mood and affect. His behavior is normal. Judgment and thought content normal.       ASSESSMENT AND PLAN

## 2013-07-28 NOTE — Addendum Note (Signed)
Addended by: Tonita PhoenixBOWDEN, Judythe Postema K on: 07/28/2013 09:56 AM   Modules accepted: Orders

## 2013-07-28 NOTE — Patient Instructions (Signed)
Your physician recommends that you schedule a follow-up appointment in: 3 months with Dr. Kirke CorinArida. Your physician recommends you make the following changes in your medications:  STOP IMDUR Begin taking LISINOPRIL 5 MG ONCE A DAY. Continue all other medications as listed.  Your physician recommends that you return for lab work in: 1 week (BMP, LIPID, LIVER PROFILE)

## 2013-07-28 NOTE — Addendum Note (Signed)
Addended by: Tonita PhoenixBOWDEN, ROBIN K on: 07/28/2013 09:55 AM   Modules accepted: Orders

## 2013-07-28 NOTE — Assessment & Plan Note (Signed)
Continue treatment with atorvastatin. Check fasting lipid and liver profile. 

## 2013-08-05 ENCOUNTER — Other Ambulatory Visit: Payer: BC Managed Care – PPO

## 2013-08-07 ENCOUNTER — Other Ambulatory Visit (INDEPENDENT_AMBULATORY_CARE_PROVIDER_SITE_OTHER): Payer: BC Managed Care – PPO

## 2013-08-07 DIAGNOSIS — I2589 Other forms of chronic ischemic heart disease: Secondary | ICD-10-CM

## 2013-08-07 DIAGNOSIS — I2510 Atherosclerotic heart disease of native coronary artery without angina pectoris: Secondary | ICD-10-CM

## 2013-08-07 DIAGNOSIS — I255 Ischemic cardiomyopathy: Secondary | ICD-10-CM

## 2013-08-07 DIAGNOSIS — E785 Hyperlipidemia, unspecified: Secondary | ICD-10-CM

## 2013-08-07 DIAGNOSIS — R42 Dizziness and giddiness: Secondary | ICD-10-CM

## 2013-08-07 DIAGNOSIS — Z79899 Other long term (current) drug therapy: Secondary | ICD-10-CM

## 2013-08-07 DIAGNOSIS — R079 Chest pain, unspecified: Secondary | ICD-10-CM

## 2013-08-07 DIAGNOSIS — R002 Palpitations: Secondary | ICD-10-CM

## 2013-08-07 DIAGNOSIS — Q231 Congenital insufficiency of aortic valve: Secondary | ICD-10-CM

## 2013-08-07 LAB — LIPID PANEL
Cholesterol: 106 mg/dL (ref 0–200)
HDL: 44.8 mg/dL (ref >39.00)
LDL CALC: 35 mg/dL (ref 0–99)
Total CHOL/HDL Ratio: 2
Triglycerides: 129 mg/dL (ref 0.0–149.0)
VLDL: 25.8 mg/dL (ref 0.0–40.0)

## 2013-08-07 LAB — BASIC METABOLIC PANEL
BUN: 10 mg/dL (ref 6–23)
CHLORIDE: 101 meq/L (ref 96–112)
CO2: 27 meq/L (ref 19–32)
CREATININE: 0.7 mg/dL (ref 0.4–1.5)
Calcium: 9.6 mg/dL (ref 8.4–10.5)
GFR: 131.99 mL/min (ref >60.00)
Glucose, Bld: 87 mg/dL (ref 70–99)
Potassium: 4.3 mEq/L (ref 3.5–5.1)
Sodium: 136 mEq/L (ref 135–145)

## 2013-08-07 LAB — HEPATIC FUNCTION PANEL
ALT: 106 U/L — ABNORMAL HIGH (ref 0–53)
AST: 44 U/L — ABNORMAL HIGH (ref 0–37)
Albumin: 4.9 g/dL (ref 3.5–5.2)
Alkaline Phosphatase: 68 U/L (ref 39–117)
BILIRUBIN DIRECT: 0.3 mg/dL (ref 0.0–0.3)
TOTAL PROTEIN: 7.5 g/dL (ref 6.0–8.3)
Total Bilirubin: 2 mg/dL — ABNORMAL HIGH (ref 0.3–1.2)

## 2013-08-12 ENCOUNTER — Telehealth: Payer: Self-pay | Admitting: *Deleted

## 2013-08-12 ENCOUNTER — Other Ambulatory Visit: Payer: Self-pay | Admitting: *Deleted

## 2013-08-12 DIAGNOSIS — Q231 Congenital insufficiency of aortic valve: Secondary | ICD-10-CM

## 2013-08-12 DIAGNOSIS — I2510 Atherosclerotic heart disease of native coronary artery without angina pectoris: Secondary | ICD-10-CM

## 2013-08-12 DIAGNOSIS — Z79899 Other long term (current) drug therapy: Secondary | ICD-10-CM

## 2013-08-12 DIAGNOSIS — E785 Hyperlipidemia, unspecified: Secondary | ICD-10-CM

## 2013-08-12 DIAGNOSIS — I255 Ischemic cardiomyopathy: Secondary | ICD-10-CM

## 2013-08-12 MED ORDER — METOPROLOL TARTRATE 25 MG PO TABS: 25.0000 mg | ORAL_TABLET | Freq: Two times a day (BID) | ORAL | Status: DC

## 2013-08-12 MED ORDER — ATORVASTATIN CALCIUM 10 MG PO TABS: 10.0000 mg | ORAL_TABLET | Freq: Every day | ORAL | Status: DC

## 2013-08-12 NOTE — Telephone Encounter (Signed)
[facility]  [place].  Change to whole a pill twice a day

## 2013-08-12 NOTE — Telephone Encounter (Signed)
Message copied by Fransico SettersBAUCOM, Elysabeth Aust E on Wed Aug 12, 2013  4:51 PM ------      Message from: Lorine BearsARIDA, MUHAMMAD A      Created: Mon Aug 10, 2013  3:59 PM       Liver test was abnormal likely due to Atorvastatin. Cholesterol was excellent. Decrease Atorvastatin to 10 mg daily. Recheck fasting lipid and liver in 6 weeks. ------

## 2013-08-14 ENCOUNTER — Other Ambulatory Visit: Payer: Self-pay | Admitting: *Deleted

## 2013-09-23 ENCOUNTER — Other Ambulatory Visit: Payer: BC Managed Care – PPO

## 2013-09-23 ENCOUNTER — Ambulatory Visit (INDEPENDENT_AMBULATORY_CARE_PROVIDER_SITE_OTHER): Payer: BC Managed Care – PPO | Admitting: *Deleted

## 2013-09-23 DIAGNOSIS — Z79899 Other long term (current) drug therapy: Secondary | ICD-10-CM

## 2013-09-23 LAB — LIPID PANEL
CHOLESTEROL: 118 mg/dL (ref 0–200)
HDL: 55.1 mg/dL (ref >39.00)
LDL CALC: 46 mg/dL (ref 0–99)
TRIGLYCERIDES: 86 mg/dL (ref 0.0–149.0)
Total CHOL/HDL Ratio: 2
VLDL: 17.2 mg/dL (ref 0.0–40.0)

## 2013-09-23 LAB — HEPATIC FUNCTION PANEL
ALBUMIN: 4.6 g/dL (ref 3.5–5.2)
ALT: 47 U/L (ref 0–53)
AST: 31 U/L (ref 0–37)
Alkaline Phosphatase: 53 U/L (ref 39–117)
Bilirubin, Direct: 0 mg/dL (ref 0.0–0.3)
TOTAL PROTEIN: 7.2 g/dL (ref 6.0–8.3)
Total Bilirubin: 1.1 mg/dL (ref 0.2–1.2)

## 2013-11-03 ENCOUNTER — Encounter: Payer: Self-pay | Admitting: Cardiovascular Disease

## 2013-11-03 ENCOUNTER — Ambulatory Visit (INDEPENDENT_AMBULATORY_CARE_PROVIDER_SITE_OTHER): Payer: BC Managed Care – PPO | Admitting: Cardiovascular Disease

## 2013-11-03 VITALS — BP 102/64 | HR 73 | Ht 67.0 in | Wt 168.0 lb

## 2013-11-03 DIAGNOSIS — I2589 Other forms of chronic ischemic heart disease: Secondary | ICD-10-CM

## 2013-11-03 DIAGNOSIS — E785 Hyperlipidemia, unspecified: Secondary | ICD-10-CM

## 2013-11-03 DIAGNOSIS — Q2381 Bicuspid aortic valve: Secondary | ICD-10-CM

## 2013-11-03 DIAGNOSIS — I2510 Atherosclerotic heart disease of native coronary artery without angina pectoris: Secondary | ICD-10-CM

## 2013-11-03 DIAGNOSIS — I255 Ischemic cardiomyopathy: Secondary | ICD-10-CM

## 2013-11-03 DIAGNOSIS — Q231 Congenital insufficiency of aortic valve: Secondary | ICD-10-CM

## 2013-11-03 NOTE — Assessment & Plan Note (Signed)
He is doing very well with no symptoms of angina. Continue medical therapy. Dual antiplatelet therapy likely for 2 year.

## 2013-11-03 NOTE — Assessment & Plan Note (Signed)
Continue small dose Metoprolol and Lisinopril. No signs of heart failure.

## 2013-11-03 NOTE — Patient Instructions (Signed)
Your physician wants you to follow-up in: 6 MONTHS with Dr Arida.  You will receive a reminder letter in the mail two months in advance. If you don't receive a letter, please call our office to schedule the follow-up appointment.  Your physician recommends that you continue on your current medications as directed. Please refer to the Current Medication list given to you today.  

## 2013-11-03 NOTE — Assessment & Plan Note (Signed)
No murmurs. Recommend repeat echo in 1-2 years.

## 2013-11-03 NOTE — Assessment & Plan Note (Signed)
Lab Results  Component Value Date   CHOL 118 09/23/2013   HDL 55.10 09/23/2013   LDLCALC 46 09/23/2013   TRIG 86.0 09/23/2013   CHOLHDL 2 09/23/2013   He had elevated liver enzymes on higher dose of Atorvastatin which resolved after decreasing the dose to 10 mg.

## 2013-11-03 NOTE — Progress Notes (Signed)
HPI  The patient is a 28 year old male who is here for a follow up visit regarding coronary artery disease. He went to the ED in January, 2015 with sudden onset central chest pain after playing basketball .he was found to have anterior ST elevation myocardial infarction. Cardiac catheterization revealed severe one vessel CAD with an occluded mid LAD thought to be plaque rupture. No other disease noted. He underwent Successful aspiration thrombectomy and drug-eluting stent placement x2 to the mid LAD. A second stent was placed due to suspected edge dissection versus spasm.  2D echo revealed EF of 40-45%, akinesis of the mid-distalanteroseptal and apical myocardium and grade one diastolic dysfuntion.  UDS was positive for amphetamines . He reported taking energy pills from a friend.  He has been doing very well and denies any chest pain, dyspnea or palpitations. He attended cardiac rehabilitation and has been doing very well. He reports no exertional symptoms.  Repeat echo in 06/2013 showed an EF of 40-45% with bicuspid aortic valve.  He continues to do well. He exercises regularly and lost weight.   Allergies  Allergen Reactions  . Penicillins Other (See Comments)    Childhood reaction     Current Outpatient Prescriptions on File Prior to Visit  Medication Sig Dispense Refill  . aspirin EC 81 MG EC tablet Take 1 tablet (81 mg total) by mouth daily.      Marland Kitchen. atorvastatin (LIPITOR) 10 MG tablet Take 1 tablet (10 mg total) by mouth daily.  30 tablet  6  . lisinopril (PRINIVIL,ZESTRIL) 5 MG tablet Take 1 tablet (5 mg total) by mouth daily.  90 tablet  3  . metoprolol tartrate (LOPRESSOR) 25 MG tablet Take 1 tablet (25 mg total) by mouth 2 (two) times daily.  60 tablet  3  . nitroGLYCERIN (NITROSTAT) 0.4 MG SL tablet Place 1 tablet (0.4 mg total) under the tongue every 5 (five) minutes as needed for chest pain.  25 tablet  12  . prasugrel (EFFIENT) 10 MG TABS tablet Take 1 tablet (10 mg total) by  mouth daily.  30 tablet  11   No current facility-administered medications on file prior to visit.     Past Medical History  Diagnosis Date  . Hyperlipidemia   . Ischemic cardiomyopathy     Ejection fraction of 40-45% due to anterior myocardial infarction  . Coronary artery disease      Past Surgical History  Procedure Laterality Date  . Cardiac catheterization    . Coronary angioplasty       No family history on file.   History   Social History  . Marital Status: Single    Spouse Name: N/A    Number of Children: N/A  . Years of Education: N/A   Occupational History  . Not on file.   Social History Main Topics  . Smoking status: Never Smoker   . Smokeless tobacco: Not on file  . Alcohol Use: Yes  . Drug Use: No  . Sexual Activity: Yes   Other Topics Concern  . Not on file   Social History Narrative  . No narrative on file      PHYSICAL EXAM   BP 102/64  Pulse 73  Ht 5\' 7"  (1.702 m)  Wt 168 lb (76.204 kg)  BMI 26.31 kg/m2 Constitutional: He is oriented to person, place, and time. He appears well-developed and well-nourished. No distress.  HENT: No nasal discharge.  Head: Normocephalic and atraumatic.  Eyes: Pupils are  equal and round.  No discharge. Neck: Normal range of motion. Neck supple. No JVD present. No thyromegaly present.  Cardiovascular: Normal rate, regular rhythm, normal heart sounds. Exam reveals no gallop and no friction rub. No murmur heard.  Pulmonary/Chest: Effort normal and breath sounds normal. No stridor. No respiratory distress. He has no wheezes. He has no rales. He exhibits no tenderness.  Abdominal: Soft. Bowel sounds are normal. He exhibits no distension. There is no tenderness. There is no rebound and no guarding.  Musculoskeletal: Normal range of motion. He exhibits no edema and no tenderness.  Neurological: He is alert and oriented to person, place, and time. Coordination normal.  Skin: Skin is warm and dry. No rash  noted. He is not diaphoretic. No erythema. No pallor.  Psychiatric: He has a normal mood and affect. His behavior is normal. Judgment and thought content normal.       ASSESSMENT AND PLAN

## 2013-12-07 ENCOUNTER — Other Ambulatory Visit: Payer: Self-pay | Admitting: Cardiovascular Disease

## 2014-01-20 ENCOUNTER — Telehealth: Payer: Self-pay | Admitting: Cardiovascular Disease

## 2014-01-20 DIAGNOSIS — I214 Non-ST elevation (NSTEMI) myocardial infarction: Secondary | ICD-10-CM

## 2014-01-20 DIAGNOSIS — I255 Ischemic cardiomyopathy: Secondary | ICD-10-CM

## 2014-01-20 DIAGNOSIS — Q231 Congenital insufficiency of aortic valve: Secondary | ICD-10-CM

## 2014-01-20 NOTE — Telephone Encounter (Signed)
Informed the pt that per Dr Kirke Corin, we can repeat his echo in 06/2014.  Pt verbalized understanding and agrees with this plan.

## 2014-01-20 NOTE — Telephone Encounter (Signed)
Follow Up   Pt requests a call back to determine if any stress test are needed before OV. Please assist

## 2014-01-20 NOTE — Telephone Encounter (Signed)
Pt calling Dr Kirke Corin to ask if he should get a repeat echo prior to his ov with Dr Kirke Corin on 05/04/14.  Pt states he would like another one done, to reassure himself that his EF is improving.  Informed pt that per Dr Jari Sportsman last dictation note from previous OV on 6/15, Dr Jari Sportsman plan was for the pt to have a repeat echo in 1-2 years.  Informed pt that I can forward this message to Dr Kirke Corin for further review and recommendation on when the echo should be repeated, and his nurse can follow-up thereafter.  Pt verbalized understanding and agrees with this plan.

## 2014-01-20 NOTE — Telephone Encounter (Signed)
We can repeat the echo in 06/2014.

## 2014-03-01 ENCOUNTER — Other Ambulatory Visit: Payer: Self-pay | Admitting: Cardiovascular Disease

## 2014-03-03 ENCOUNTER — Telehealth: Payer: Self-pay | Admitting: Cardiovascular Disease

## 2014-03-03 NOTE — Telephone Encounter (Signed)
New message      What can he take for a cold that will be ok with his other medications?

## 2014-03-03 NOTE — Telephone Encounter (Signed)
I spoke with the pt and advised him of OTC medications that are safe to take from a cardiac stand point.  The pt also asked if he should get the flu shot. I made him aware that he should get the flu shot.

## 2014-03-31 ENCOUNTER — Other Ambulatory Visit: Payer: Self-pay | Admitting: Cardiovascular Disease

## 2014-04-29 ENCOUNTER — Encounter (HOSPITAL_COMMUNITY): Payer: Self-pay | Admitting: Cardiovascular Disease

## 2014-05-04 ENCOUNTER — Ambulatory Visit: Payer: BC Managed Care – PPO | Admitting: Cardiovascular Disease

## 2014-05-31 ENCOUNTER — Encounter: Payer: Self-pay | Admitting: *Deleted

## 2014-06-01 ENCOUNTER — Ambulatory Visit (INDEPENDENT_AMBULATORY_CARE_PROVIDER_SITE_OTHER): Payer: BLUE CROSS/BLUE SHIELD | Admitting: Cardiovascular Disease

## 2014-06-01 ENCOUNTER — Encounter: Payer: Self-pay | Admitting: Cardiovascular Disease

## 2014-06-01 VITALS — BP 106/62 | HR 54 | Ht 66.0 in | Wt 182.0 lb

## 2014-06-01 DIAGNOSIS — E785 Hyperlipidemia, unspecified: Secondary | ICD-10-CM

## 2014-06-01 DIAGNOSIS — Q231 Congenital insufficiency of aortic valve: Secondary | ICD-10-CM

## 2014-06-01 DIAGNOSIS — I2510 Atherosclerotic heart disease of native coronary artery without angina pectoris: Secondary | ICD-10-CM

## 2014-06-01 DIAGNOSIS — I255 Ischemic cardiomyopathy: Secondary | ICD-10-CM

## 2014-06-01 NOTE — Patient Instructions (Signed)
Your physician wants you to follow-up in: 6 MONTHS with Dr Arida.  You will receive a reminder letter in the mail two months in advance. If you don't receive a letter, please call our office to schedule the follow-up appointment.  Your physician recommends that you continue on your current medications as directed. Please refer to the Current Medication list given to you today.  

## 2014-06-01 NOTE — Assessment & Plan Note (Signed)
Continue small dose Metoprolol and Lisinopril. No signs of heart failure. NYHA class 1

## 2014-06-01 NOTE — Assessment & Plan Note (Signed)
Lab Results  Component Value Date   CHOL 118 09/23/2013   HDL 55.10 09/23/2013   LDLCALC 46 09/23/2013   TRIG 86.0 09/23/2013   CHOLHDL 2 09/23/2013   Continue Atorvastatin.

## 2014-06-01 NOTE — Assessment & Plan Note (Signed)
No murmurs by exam. I will consider an echo in 1 year.

## 2014-06-01 NOTE — Assessment & Plan Note (Signed)
He is doing very well with no symptoms of angina. Continue medical therapy. Dual antiplatelet therapy likely for 2 year.  

## 2014-06-01 NOTE — Progress Notes (Signed)
HPI  The patient is a 29 year old male who is here for a follow up visit regarding coronary artery disease. He went to the ED in January, 2015 with sudden onset central chest pain after playing basketball .he was found to have anterior ST elevation myocardial infarction. Cardiac catheterization revealed severe one vessel CAD with an occluded mid LAD thought to be plaque rupture. No other disease noted. He underwent Successful aspiration thrombectomy and drug-eluting stent placement x2 to the mid LAD. A second stent was placed due to suspected edge dissection versus spasm.  2D echo revealed EF of 40-45%, akinesis of the mid-distalanteroseptal and apical myocardium and grade one diastolic dysfuntion.  UDS was positive for amphetamines . He reported taking energy pills from a friend.  He has been doing very well and denies any chest pain, dyspnea or palpitations. He attended cardiac rehabilitation and has been doing very well. He reports no exertional symptoms.  Repeat echo in 06/2013 showed an EF of 40-45% with bicuspid aortic valve.  He continues to do well. He denies any chest pain, dyspnea or palpitations. He gained some weight over the holidays.   Allergies  Allergen Reactions  . Penicillins Other (See Comments)    Childhood reaction     Current Outpatient Prescriptions on File Prior to Visit  Medication Sig Dispense Refill  . aspirin EC 81 MG EC tablet Take 1 tablet (81 mg total) by mouth daily.    Marland Kitchen atorvastatin (LIPITOR) 10 MG tablet TAKE 1 TABLET BY MOUTH DAILY 30 tablet 3  . lisinopril (PRINIVIL,ZESTRIL) 5 MG tablet Take 1 tablet (5 mg total) by mouth daily. 90 tablet 3  . metoprolol tartrate (LOPRESSOR) 25 MG tablet TAKE 1 TABLET BY MOUTH TWICE DAILY 60 tablet 3  . nitroGLYCERIN (NITROSTAT) 0.4 MG SL tablet Place 1 tablet (0.4 mg total) under the tongue every 5 (five) minutes as needed for chest pain. 25 tablet 12  . prasugrel (EFFIENT) 10 MG TABS tablet Take 1 tablet (10 mg total)  by mouth daily. 30 tablet 11   No current facility-administered medications on file prior to visit.     Past Medical History  Diagnosis Date  . Hyperlipidemia   . Ischemic cardiomyopathy     Ejection fraction of 40-45% due to anterior myocardial infarction  . Coronary artery disease      Past Surgical History  Procedure Laterality Date  . Cardiac catheterization    . Coronary angioplasty    . Left heart catheterization with coronary angiogram N/A 06/14/2013    Procedure: LEFT HEART CATHETERIZATION WITH CORONARY ANGIOGRAM;  Surgeon: Iran Ouch, MD;  Location: [facility] CATH LAB;  Service: Cardiovascular;  Laterality: N/A;  . Percutaneous coronary stent intervention (pci-s)  06/14/2013    Procedure: PERCUTANEOUS CORONARY STENT INTERVENTION (PCI-S);  Surgeon: Iran Ouch, MD;  Location: [facility] CATH LAB;  Service: Cardiovascular;;  mid LAD 2DES placed     History reviewed. No pertinent family history.   History   Social History  . Marital Status: Single    Spouse Name: N/A    Number of Children: N/A  . Years of Education: N/A   Occupational History  . Not on file.   Social History Main Topics  . Smoking status: Never Smoker   . Smokeless tobacco: Not on file  . Alcohol Use: Yes  . Drug Use: No  . Sexual Activity: Yes   Other Topics Concern  . Not on file   Social History Narrative  PHYSICAL EXAM   BP 106/62 mmHg  Pulse 54  Ht 5\' 6"  (1.676 m)  Wt 182 lb (82.555 kg)  BMI 29.39 kg/m2 Constitutional: He is oriented to person, place, and time. He appears well-developed and well-nourished. No distress.  HENT: No nasal discharge.  Head: Normocephalic and atraumatic.  Eyes: Pupils are equal and round.  No discharge. Neck: Normal range of motion. Neck supple. No JVD present. No thyromegaly present.  Cardiovascular: Normal rate, regular rhythm, normal heart sounds. Exam reveals no gallop and no friction rub. No murmur heard.  Pulmonary/Chest: Effort normal and  breath sounds normal. No stridor. No respiratory distress. He has no wheezes. He has no rales. He exhibits no tenderness.  Abdominal: Soft. Bowel sounds are normal. He exhibits no distension. There is no tenderness. There is no rebound and no guarding.  Musculoskeletal: Normal range of motion. He exhibits no edema and no tenderness.  Neurological: He is alert and oriented to person, place, and time. Coordination normal.  Skin: Skin is warm and dry. No rash noted. He is not diaphoretic. No erythema. No pallor.  Psychiatric: He has a normal mood and affect. His behavior is normal. Judgment and thought content normal.    EKG: sinus bradycardia, old anteroseptal infarct.    ASSESSMENT AND PLAN

## 2014-06-02 ENCOUNTER — Other Ambulatory Visit: Payer: Self-pay | Admitting: Physician Assistant

## 2014-07-05 ENCOUNTER — Other Ambulatory Visit: Payer: Self-pay | Admitting: Physician Assistant

## 2014-07-06 ENCOUNTER — Other Ambulatory Visit: Payer: Self-pay

## 2014-07-19 ENCOUNTER — Other Ambulatory Visit: Payer: Self-pay | Admitting: Cardiovascular Disease

## 2014-08-03 ENCOUNTER — Other Ambulatory Visit: Payer: Self-pay | Admitting: Cardiovascular Disease

## 2014-12-07 ENCOUNTER — Encounter: Payer: Self-pay | Admitting: *Deleted

## 2014-12-14 ENCOUNTER — Encounter: Payer: Self-pay | Admitting: Cardiovascular Disease

## 2014-12-14 ENCOUNTER — Ambulatory Visit (INDEPENDENT_AMBULATORY_CARE_PROVIDER_SITE_OTHER): Payer: BLUE CROSS/BLUE SHIELD | Admitting: Cardiovascular Disease

## 2014-12-14 VITALS — BP 104/76 | HR 67 | Ht 67.0 in | Wt 176.0 lb

## 2014-12-14 DIAGNOSIS — I359 Nonrheumatic aortic valve disorder, unspecified: Secondary | ICD-10-CM | POA: Diagnosis not present

## 2014-12-14 DIAGNOSIS — Q231 Congenital insufficiency of aortic valve: Secondary | ICD-10-CM

## 2014-12-14 DIAGNOSIS — I2510 Atherosclerotic heart disease of native coronary artery without angina pectoris: Secondary | ICD-10-CM | POA: Diagnosis not present

## 2014-12-14 DIAGNOSIS — I255 Ischemic cardiomyopathy: Secondary | ICD-10-CM

## 2014-12-14 DIAGNOSIS — E785 Hyperlipidemia, unspecified: Secondary | ICD-10-CM | POA: Diagnosis not present

## 2014-12-14 LAB — HEPATIC FUNCTION PANEL
ALK PHOS: 55 U/L (ref 39–117)
ALT: 53 U/L (ref 0–53)
AST: 25 U/L (ref 0–37)
Albumin: 4.9 g/dL (ref 3.5–5.2)
BILIRUBIN DIRECT: 0.2 mg/dL (ref 0.0–0.3)
BILIRUBIN TOTAL: 0.7 mg/dL (ref 0.2–1.2)
Total Protein: 7.8 g/dL (ref 6.0–8.3)

## 2014-12-14 LAB — LIPID PANEL
Cholesterol: 141 mg/dL (ref 0–200)
HDL: 54.8 mg/dL (ref >39.00)
LDL Cholesterol: 55 mg/dL (ref 0–99)
NonHDL: 86.2
TRIGLYCERIDES: 158 mg/dL — AB (ref 0.0–149.0)
Total CHOL/HDL Ratio: 3
VLDL: 31.6 mg/dL (ref 0.0–40.0)

## 2014-12-14 NOTE — Assessment & Plan Note (Signed)
He is doing very well with no symptoms suggestive of angina. I am planning to discontinue Effient after 6 months from now.

## 2014-12-14 NOTE — Progress Notes (Signed)
HPI  The patient is a 29 year old male who is here for a follow up visit regarding coronary artery disease. He went to the ED in January, 2015 with sudden onset central chest pain after playing basketball .he was found to have anterior ST elevation myocardial infarction. Cardiac catheterization revealed severe one vessel CAD with an occluded mid LAD thought to be plaque rupture. No other disease noted. He underwent Successful aspiration thrombectomy and drug-eluting stent placement x2 to the mid LAD. A second stent was placed due to suspected edge dissection versus spasm.  2D echo revealed EF of 40-45%, akinesis of the mid-distalanteroseptal and apical myocardium and grade one diastolic dysfuntion.  UDS was positive for amphetamines . He reported taking energy pills from a friend.  He has been doing very well and denies any chest pain, dyspnea or palpitations.  Repeat echo in 06/2013 showed an EF of 40-45% with bicuspid aortic valve.  He continues to do well. He denies any chest pain, dyspnea or palpitations. He is taking all medications as prescribed. He occasional mild bruising but otherwise no side effects to medications.  Allergies  Allergen Reactions  . Penicillins Other (See Comments)    Unknown Childhood reaction     Current Outpatient Prescriptions on File Prior to Visit  Medication Sig Dispense Refill  . aspirin EC 81 MG EC tablet Take 1 tablet (81 mg total) by mouth daily.    Marland Kitchen atorvastatin (LIPITOR) 10 MG tablet TAKE 1 TABLET BY MOUTH DAILY 30 tablet 6  . EFFIENT 10 MG TABS tablet TAKE 1 TABLET BY MOUTH DAILY 30 tablet 6  . lisinopril (PRINIVIL,ZESTRIL) 5 MG tablet TAKE 1 TABLET BY MOUTH DAILY 90 tablet 3  . metoprolol tartrate (LOPRESSOR) 25 MG tablet TAKE 1 TABLET BY MOUTH TWICE DAILY 60 tablet 6  . nitroGLYCERIN (NITROSTAT) 0.4 MG SL tablet Place 1 tablet (0.4 mg total) under the tongue every 5 (five) minutes as needed for chest pain. 25 tablet 12   No current  facility-administered medications on file prior to visit.     Past Medical History  Diagnosis Date  . Hyperlipidemia   . Ischemic cardiomyopathy     Ejection fraction of 40-45% due to anterior myocardial infarction  . Coronary artery disease      Past Surgical History  Procedure Laterality Date  . Cardiac catheterization    . Coronary angioplasty    . Left heart catheterization with coronary angiogram N/A 06/14/2013    Procedure: LEFT HEART CATHETERIZATION WITH CORONARY ANGIOGRAM;  Surgeon: Iran Ouch, MD;  Location: [facility] CATH LAB;  Service: Cardiovascular;  Laterality: N/A;  . Percutaneous coronary stent intervention (pci-s)  06/14/2013    Procedure: PERCUTANEOUS CORONARY STENT INTERVENTION (PCI-S);  Surgeon: Iran Ouch, MD;  Location: [facility] CATH LAB;  Service: Cardiovascular;;  mid LAD 2DES placed     Family History  Problem Relation Age of Onset  . Diabetes Paternal Grandmother   . Coronary artery disease Paternal Grandmother      History   Social History  . Marital Status: Single    Spouse Name: N/A  . Number of Children: N/A  . Years of Education: N/A   Occupational History  . Not on file.   Social History Main Topics  . Smoking status: Never Smoker   . Smokeless tobacco: Not on file  . Alcohol Use: Yes  . Drug Use: No  . Sexual Activity: Yes   Other Topics Concern  . Not on file  Social History Narrative      PHYSICAL EXAM   BP 104/76 mmHg  Pulse 67  Ht  (1.702 m)  Wt 176 lb (79.833 kg)  BMI 27.56 kg/m2 Constitutional: He is oriented to person, place, and time. He appears well-developed and well-nourished. No distress.  HENT: No nasal discharge.  Head: Normocephalic and atraumatic.  Eyes: Pupils are equal and round.  No discharge. Neck: Normal range of motion. Neck supple. No JVD present. No thyromegaly present.  Cardiovascular: Normal rate, regular rhythm, normal heart sounds. Exam reveals no gallop and no friction rub. No murmur  heard.  Pulmonary/Chest: Effort normal and breath sounds normal. No stridor. No respiratory distress. He has no wheezes. He has no rales. He exhibits no tenderness.  Abdominal: Soft. Bowel sounds are normal. He exhibits no distension. There is no tenderness. There is no rebound and no guarding.  Musculoskeletal: Normal range of motion. He exhibits no edema and no tenderness.  Neurological: He is alert and oriented to person, place, and time. Coordination normal.  Skin: Skin is warm and dry. No rash noted. He is not diaphoretic. No erythema. No pallor.  Psychiatric: He has a normal mood and affect. His behavior is normal. Judgment and thought content normal.      ASSESSMENT AND PLAN

## 2014-12-14 NOTE — Assessment & Plan Note (Signed)
Continue treatment with atorvastatin. Check fasting lipid and liver profile today. 

## 2014-12-14 NOTE — Patient Instructions (Signed)
Medication Instructions:  Your physician recommends that you continue on your current medications as directed. Please refer to the Current Medication list given to you today.  Labwork: Your physician recommends that you have lab work today: LIPID and LIVER  Testing/Procedures: Your physician has requested that you have an echocardiogram in 6 MONTHS (1 week prior to appointment with Dr Kirke Corin). Echocardiography is a painless test that uses sound waves to create images of your heart. It provides your doctor with information about the size and shape of your heart and how well your heart's chambers and valves are working. This procedure takes approximately one hour. There are no restrictions for this procedure.  Follow-Up: Your physician wants you to follow-up in: 6 MONTHS with Dr Kirke Corin.  You will receive a reminder letter in the mail two months in advance. If you don't receive a letter, please call our office to schedule the follow-up appointment.   Any Other Special Instructions Will Be Listed Below (If Applicable).

## 2014-12-14 NOTE — Assessment & Plan Note (Signed)
I requested an echocardiogram to be done in 6 months from now with his next visit.

## 2014-12-14 NOTE — Assessment & Plan Note (Signed)
Recheck LV systolic function as outlined above. Continue treatment with small dose metoprolol and lisinopril. He has no signs of fluid overload.

## 2015-03-03 ENCOUNTER — Other Ambulatory Visit: Payer: Self-pay | Admitting: Cardiovascular Disease

## 2015-06-07 ENCOUNTER — Other Ambulatory Visit (HOSPITAL_COMMUNITY): Payer: BLUE CROSS/BLUE SHIELD

## 2015-06-14 ENCOUNTER — Other Ambulatory Visit (HOSPITAL_COMMUNITY): Payer: BLUE CROSS/BLUE SHIELD

## 2015-06-14 ENCOUNTER — Ambulatory Visit: Payer: BLUE CROSS/BLUE SHIELD | Admitting: Cardiovascular Disease

## 2015-06-20 ENCOUNTER — Other Ambulatory Visit: Payer: Self-pay | Admitting: Cardiovascular Disease

## 2015-06-20 DIAGNOSIS — R002 Palpitations: Secondary | ICD-10-CM

## 2015-06-21 ENCOUNTER — Ambulatory Visit (HOSPITAL_COMMUNITY): Payer: BLUE CROSS/BLUE SHIELD | Attending: Cardiology

## 2015-06-21 ENCOUNTER — Other Ambulatory Visit: Payer: Self-pay

## 2015-06-21 DIAGNOSIS — R002 Palpitations: Secondary | ICD-10-CM | POA: Diagnosis not present

## 2015-06-21 DIAGNOSIS — Q231 Congenital insufficiency of aortic valve: Secondary | ICD-10-CM | POA: Diagnosis not present

## 2015-06-28 ENCOUNTER — Ambulatory Visit (INDEPENDENT_AMBULATORY_CARE_PROVIDER_SITE_OTHER): Payer: BLUE CROSS/BLUE SHIELD | Admitting: Cardiovascular Disease

## 2015-06-28 ENCOUNTER — Encounter: Payer: Self-pay | Admitting: Cardiovascular Disease

## 2015-06-28 VITALS — BP 112/68 | HR 67 | Ht 67.0 in | Wt 145.1 lb

## 2015-06-28 DIAGNOSIS — E785 Hyperlipidemia, unspecified: Secondary | ICD-10-CM

## 2015-06-28 DIAGNOSIS — Q231 Congenital insufficiency of aortic valve: Secondary | ICD-10-CM

## 2015-06-28 DIAGNOSIS — I2510 Atherosclerotic heart disease of native coronary artery without angina pectoris: Secondary | ICD-10-CM

## 2015-06-28 NOTE — Progress Notes (Signed)
HPI  The patient is a 30 year old male who is here for a follow up visit regarding coronary artery disease and bicuspid aortic valve. Marland Kitchen He went to the ED in January, 2015 with sudden onset central chest pain after playing basketball .he was found to have anterior ST elevation myocardial infarction. Cardiac catheterization revealed severe one vessel CAD with an occluded mid LAD thought to be plaque rupture. No other disease noted. He underwent Successful aspiration thrombectomy and drug-eluting stent placement x2 to the mid LAD. A second stent was placed due to suspected edge dissection versus spasm.  2D echo revealed EF of 40-45%, akinesis of the mid-distalanteroseptal and apical myocardium and grade one diastolic dysfuntion.  UDS was positive for amphetamines . He reported taking energy pills from a friend.  He has been doing very well and denies any chest pain, dyspnea or palpitations.  Repeat echo in 06/2013 showed an EF of 40-45% with bicuspid aortic valve.  He has been doing very well and denies any chest pain shortness of breath or palpitations. He has been taking his medications regularly without any side effects. He is going to the gym on a regular basis. He underwent a repeat echocardiogram last month which showed normal LV systolic function with bicuspid aortic valve and no significant stenosis or regurgitation.  Allergies  Allergen Reactions  . Penicillins Other (See Comments)    Unknown Childhood reaction     Current Outpatient Prescriptions on File Prior to Visit  Medication Sig Dispense Refill  . aspirin EC 81 MG EC tablet Take 1 tablet (81 mg total) by mouth daily.    Marland Kitchen atorvastatin (LIPITOR) 10 MG tablet TAKE 1 TABLET BY MOUTH DAILY 30 tablet 6  . EFFIENT 10 MG TABS tablet TAKE 1 TABLET BY MOUTH EVERY DAY 30 tablet 6  . lisinopril (PRINIVIL,ZESTRIL) 5 MG tablet TAKE 1 TABLET BY MOUTH DAILY 90 tablet 3  . metoprolol tartrate (LOPRESSOR) 25 MG tablet TAKE 1 TABLET BY MOUTH  TWICE DAILY 60 tablet 6  . nitroGLYCERIN (NITROSTAT) 0.4 MG SL tablet Place 1 tablet (0.4 mg total) under the tongue every 5 (five) minutes as needed for chest pain. 25 tablet 12   No current facility-administered medications on file prior to visit.     Past Medical History  Diagnosis Date  . Hyperlipidemia   . Ischemic cardiomyopathy     Ejection fraction of 40-45% due to anterior myocardial infarction  . Coronary artery disease      Past Surgical History  Procedure Laterality Date  . Cardiac catheterization    . Coronary angioplasty    . Left heart catheterization with coronary angiogram N/A 06/14/2013    Procedure: LEFT HEART CATHETERIZATION WITH CORONARY ANGIOGRAM;  Surgeon: Iran Ouch, MD;  Location: [facility] CATH LAB;  Service: Cardiovascular;  Laterality: N/A;  . Percutaneous coronary stent intervention (pci-s)  06/14/2013    Procedure: PERCUTANEOUS CORONARY STENT INTERVENTION (PCI-S);  Surgeon: Iran Ouch, MD;  Location: [facility] CATH LAB;  Service: Cardiovascular;;  mid LAD 2DES placed     Family History  Problem Relation Age of Onset  . Diabetes Paternal Grandmother   . Coronary artery disease Paternal Grandmother   . Healthy Mother   . Healthy Father      Social History   Social History  . Marital Status: Single    Spouse Name: N/A  . Number of Children: N/A  . Years of Education: N/A   Occupational History  . Not on file.  Social History Main Topics  . Smoking status: Never Smoker   . Smokeless tobacco: Never Used  . Alcohol Use: 0.0 oz/week    0 Standard drinks or equivalent per week  . Drug Use: No  . Sexual Activity: Yes   Other Topics Concern  . Not on file   Social History Narrative      PHYSICAL EXAM   BP 112/68 mmHg  Pulse 67  Ht  (1.702 m)  Wt 145 lb 1.9 oz (65.826 kg)  BMI 22.72 kg/m2 Constitutional: He is oriented to person, place, and time. He appears well-developed and well-nourished. No distress.  HENT: No nasal  discharge.  Head: Normocephalic and atraumatic.  Eyes: Pupils are equal and round.  No discharge. Neck: Normal range of motion. Neck supple. No JVD present. No thyromegaly present.  Cardiovascular: Normal rate, regular rhythm, normal heart sounds. Exam reveals no gallop and no friction rub. No murmur heard.  Pulmonary/Chest: Effort normal and breath sounds normal. No stridor. No respiratory distress. He has no wheezes. He has no rales. He exhibits no tenderness.  Abdominal: Soft. Bowel sounds are normal. He exhibits no distension. There is no tenderness. There is no rebound and no guarding.  Musculoskeletal: Normal range of motion. He exhibits no edema and no tenderness.  Neurological: He is alert and oriented to person, place, and time. Coordination normal.  Skin: Skin is warm and dry. No rash noted. He is not diaphoretic. No erythema. No pallor.  Psychiatric: He has a normal mood and affect. His behavior is normal. Judgment and thought content normal.   EKG: Normal sinus rhythm with old septal infarct.   ASSESSMENT AND PLAN

## 2015-06-28 NOTE — Assessment & Plan Note (Signed)
He is doing extremely well with no symptoms suggestive of angina. It has been more than 2 years since his myocardial infarction. Thus, I discontinued Effient today. Continue other cardiac medications.

## 2015-06-28 NOTE — Assessment & Plan Note (Signed)
Lab Results  Component Value Date   CHOL 141 12/14/2014   HDL 54.80 12/14/2014   LDLCALC 55 12/14/2014   TRIG 158.0* 12/14/2014   CHOLHDL 3 12/14/2014   The patient improved his diet and cut down on carbohydrates significantly. I suspect that his triglyceride is likely within the normal range now. Continue small dose atorvastatin.

## 2015-06-28 NOTE — Patient Instructions (Signed)
Medication Instructions:  Your physician has recommended you make the following change in your medication:  1. STOP Effient  Labwork: No new orders.   Testing/Procedures: No new orders.   Follow-Up: Your physician wants you to follow-up in: 1 YEAR with Dr Kirke Corin.  You will receive a reminder letter in the mail two months in advance. If you don't receive a letter, please call our office to schedule the follow-up appointment.   Any Other Special Instructions Will Be Listed Below (If Applicable).     If you need a refill on your cardiac medications before your next appointment, please call your pharmacy.

## 2015-06-28 NOTE — Assessment & Plan Note (Signed)
This is not associated with any stenosis or regurgitation. Continue to monitor with echocardiogram few years.

## 2015-07-02 ENCOUNTER — Emergency Department (HOSPITAL_BASED_OUTPATIENT_CLINIC_OR_DEPARTMENT_OTHER): Payer: BLUE CROSS/BLUE SHIELD

## 2015-07-02 ENCOUNTER — Encounter (HOSPITAL_BASED_OUTPATIENT_CLINIC_OR_DEPARTMENT_OTHER): Payer: Self-pay | Admitting: *Deleted

## 2015-07-02 ENCOUNTER — Emergency Department (HOSPITAL_BASED_OUTPATIENT_CLINIC_OR_DEPARTMENT_OTHER)
Admission: EM | Admit: 2015-07-02 | Discharge: 2015-07-02 | Disposition: A | Discharge status: Home / Self Care | Payer: BLUE CROSS/BLUE SHIELD | Attending: Emergency Medicine | Admitting: Emergency Medicine

## 2015-07-02 DIAGNOSIS — Z9861 Coronary angioplasty status: Secondary | ICD-10-CM | POA: Insufficient documentation

## 2015-07-02 DIAGNOSIS — Z7982 Long term (current) use of aspirin: Secondary | ICD-10-CM | POA: Diagnosis not present

## 2015-07-02 DIAGNOSIS — Z79899 Other long term (current) drug therapy: Secondary | ICD-10-CM | POA: Insufficient documentation

## 2015-07-02 DIAGNOSIS — Z7902 Long term (current) use of antithrombotics/antiplatelets: Secondary | ICD-10-CM | POA: Insufficient documentation

## 2015-07-02 DIAGNOSIS — N50811 Right testicular pain: Secondary | ICD-10-CM

## 2015-07-02 DIAGNOSIS — Z9889 Other specified postprocedural states: Secondary | ICD-10-CM | POA: Diagnosis not present

## 2015-07-02 DIAGNOSIS — Z880 Allergy status to penicillin: Secondary | ICD-10-CM | POA: Insufficient documentation

## 2015-07-02 DIAGNOSIS — E785 Hyperlipidemia, unspecified: Secondary | ICD-10-CM | POA: Insufficient documentation

## 2015-07-02 DIAGNOSIS — N433 Hydrocele, unspecified: Secondary | ICD-10-CM | POA: Diagnosis not present

## 2015-07-02 DIAGNOSIS — I2510 Atherosclerotic heart disease of native coronary artery without angina pectoris: Secondary | ICD-10-CM | POA: Insufficient documentation

## 2015-07-02 LAB — URINE MICROSCOPIC-ADD ON
RBC / HPF: NONE SEEN RBC/hpf (ref 0–5)
WBC UA: NONE SEEN WBC/hpf (ref 0–5)

## 2015-07-02 LAB — URINALYSIS, ROUTINE W REFLEX MICROSCOPIC
Bilirubin Urine: NEGATIVE
Glucose, UA: NEGATIVE mg/dL
Hgb urine dipstick: NEGATIVE
KETONES UR: NEGATIVE mg/dL
Leukocytes, UA: NEGATIVE
NITRITE: NEGATIVE
PH: 7 (ref 5.0–8.0)
PROTEIN: NEGATIVE mg/dL
Specific Gravity, Urine: 1.017 (ref 1.005–1.030)

## 2015-07-02 NOTE — ED Provider Notes (Signed)
CSN: [id number]     Arrival date & time 07/02/15  1323 History   First MD Initiated Contact with Patient 07/02/15 1340     Chief Complaint  Patient presents with  . Groin Pain     (Consider location/radiation/quality/duration/timing/severity/associated sxs/prior Treatment) Patient is a 30 y.o. male presenting with groin pain. The history is provided by the patient.  Groin Pain This is a new problem. The current episode started 6 to 12 hours ago. The problem occurs constantly. The problem has not changed since onset.Pertinent negatives include no chest pain, no abdominal pain, no headaches and no shortness of breath. Nothing aggravates the symptoms. Nothing relieves the symptoms. He has tried nothing for the symptoms. The treatment provided no relief.   30 yo M with a chief complaint of right testicular pain. This been going on for the past day. Patient started really examining and felt like the external vasculature of his penis looked abnormal on the right side. Feels like it doesn't feel normal as well. States the pain is in the right testicle as well as in the right inguinal area. Denies penile discharge denies dissections medical diseases. Denies rash. Denies injury.  Past Medical History  Diagnosis Date  . Hyperlipidemia   . Ischemic cardiomyopathy     Ejection fraction of 40-45% due to anterior myocardial infarction  . Coronary artery disease    Past Surgical History  Procedure Laterality Date  . Cardiac catheterization    . Coronary angioplasty    . Left heart catheterization with coronary angiogram N/A 06/14/2013    Procedure: LEFT HEART CATHETERIZATION WITH CORONARY ANGIOGRAM;  Surgeon: Iran Ouch, MD;  Location: [facility] CATH LAB;  Service: Cardiovascular;  Laterality: N/A;  . Percutaneous coronary stent intervention (pci-s)  06/14/2013    Procedure: PERCUTANEOUS CORONARY STENT INTERVENTION (PCI-S);  Surgeon: Iran Ouch, MD;  Location: [facility] CATH LAB;  Service:  Cardiovascular;;  mid LAD 2DES placed   Family History  Problem Relation Age of Onset  . Diabetes Paternal Grandmother   . Coronary artery disease Paternal Grandmother   . Healthy Mother   . Healthy Father    Social History  Substance Use Topics  . Smoking status: Never Smoker   . Smokeless tobacco: Never Used  . Alcohol Use: 0.0 oz/week    0 Standard drinks or equivalent per week    Review of Systems  Constitutional: Negative for fever and chills.  HENT: Negative for congestion and facial swelling.   Eyes: Negative for discharge, itching and visual disturbance.  Respiratory: Negative for shortness of breath.   Cardiovascular: Negative for chest pain and palpitations.  Gastrointestinal: Negative for vomiting, abdominal pain and diarrhea.  Genitourinary: Positive for testicular pain.  Musculoskeletal: Negative for myalgias and arthralgias.  Skin: Negative for color change and rash.  Neurological: Negative for tremors, syncope and headaches.  Psychiatric/Behavioral: Negative for confusion and dysphoric mood.      Allergies  Penicillins  Home Medications   Prior to Admission medications   Medication Sig Start Date End Date Taking? Authorizing Provider  prasugrel (EFFIENT) 10 MG TABS tablet Take 10 mg by mouth daily.   Yes Historical Provider, MD  aspirin EC 81 MG EC tablet Take 1 tablet (81 mg total) by mouth daily. 06/17/13   Dwana Melena, PA-C  atorvastatin (LIPITOR) 10 MG tablet TAKE 1 TABLET BY MOUTH DAILY 03/03/15   Iran Ouch, MD  lisinopril (PRINIVIL,ZESTRIL) 5 MG tablet TAKE 1 TABLET BY MOUTH DAILY 07/19/14   Jerolyn Center  A Kirke Corin, MD  metoprolol tartrate (LOPRESSOR) 25 MG tablet TAKE 1 TABLET BY MOUTH TWICE DAILY 03/03/15   Iran Ouch, MD  Multiple Vitamin (MULTI-VITAMINS) TABS Take 1 tablet by mouth daily.    Historical Provider, MD  nitroGLYCERIN (NITROSTAT) 0.4 MG SL tablet Place 1 tablet (0.4 mg total) under the tongue every 5 (five) minutes as needed for  chest pain. 06/17/13   Dwana Melena, PA-C   BP 119/66 mmHg  Pulse 78  Temp(Src) 98.7 F (37.1 C) (Oral)  Resp 18  Ht 5\' 7"  (1.702 m)  Wt 150 lb (68.04 kg)  BMI 23.49 kg/m2  SpO2 100% Physical Exam  Constitutional: He is oriented to person, place, and time. He appears well-developed and well-nourished.  HENT:  Head: Normocephalic and atraumatic.  Eyes: EOM are normal. Pupils are equal, round, and reactive to light.  Neck: Normal range of motion. Neck supple. No JVD present.  Cardiovascular: Normal rate and regular rhythm.  Exam reveals no gallop and no friction rub.   No murmur heard. Pulmonary/Chest: No respiratory distress. He has no wheezes.  Abdominal: He exhibits no distension. There is no tenderness. There is no rebound and no guarding. Hernia confirmed negative in the right inguinal area and confirmed negative in the left inguinal area.  Genitourinary: Testes normal.    Cremasteric reflex is present. Right testis shows no mass, no swelling and no tenderness. Left testis shows no mass, no swelling and no tenderness. Circumcised. No penile tenderness. No discharge found.  Musculoskeletal: Normal range of motion.  Lymphadenopathy:       Right: No inguinal adenopathy present.       Left: No inguinal adenopathy present.  Neurological: He is alert and oriented to person, place, and time.  Skin: No rash noted. No pallor.  Psychiatric: He has a normal mood and affect. His behavior is normal.  Nursing note and vitals reviewed.   ED Course  Procedures (including critical care time) Labs Review Labs Reviewed  URINALYSIS, ROUTINE W REFLEX MICROSCOPIC (NOT AT [facility])  GC/CHLAMYDIA PROBE AMP ([place]) NOT AT [facility]    Imaging Review US Scrotum  07/02/2015  CLINICAL DATA:  30 year old male with acute right testicular pain today. EXAM: SCROTAL ULTRASOUND DOPPLER ULTRASOUND OF THE TESTICLES TECHNIQUE: Complete ultrasound examination of the testicles, epididymis, and other scrotal  structures was performed. Color and spectral Doppler ultrasound were also utilized to evaluate blood flow to the testicles. COMPARISON:  None. FINDINGS: Right testicle Measurements: 4.7 x 2.2 x 3.3 cm. No mass or microlithiasis visualized. Left testicle Measurements: 4.5 x 2.4 x 3.3 cm. No mass or microlithiasis visualized. Right epididymis:  A 3 mm cyst/spermatocele noted. Left epididymis:  A 4 mm cyst/ spermatocele noted. Hydrocele:  Small right hydrocele noted. Varicocele:  None visualized. Pulsed Doppler interrogation of both testes demonstrates normal low resistance arterial and venous waveforms bilaterally. IMPRESSION: Normal testicles.  No evidence of testicular torsion. Small right hydrocele. Electronically Signed   By: Harmon Pier M.D.   On: 07/02/2015 14:54   [place] Art/ven Flow Abd Pelv Doppler  07/02/2015  CLINICAL DATA:  30 year old male with acute right testicular pain today. EXAM: SCROTAL ULTRASOUND DOPPLER ULTRASOUND OF THE TESTICLES TECHNIQUE: Complete ultrasound examination of the testicles, epididymis, and other scrotal structures was performed. Color and spectral Doppler ultrasound were also utilized to evaluate blood flow to the testicles. COMPARISON:  None. FINDINGS: Right testicle Measurements: 4.7 x 2.2 x 3.3 cm. No mass or microlithiasis visualized. Left testicle Measurements: 4.5 x  2.4 x 3.3 cm. No mass or microlithiasis visualized. Right epididymis:  A 3 mm cyst/spermatocele noted. Left epididymis:  A 4 mm cyst/ spermatocele noted. Hydrocele:  Small right hydrocele noted. Varicocele:  None visualized. Pulsed Doppler interrogation of both testes demonstrates normal low resistance arterial and venous waveforms bilaterally. IMPRESSION: Normal testicles.  No evidence of testicular torsion. Small right hydrocele. Electronically Signed   By: Harmon Pier M.D.   On: 07/02/2015 14:54   I have personally reviewed and evaluated these images and lab results as part of my medical decision-making.    EKG Interpretation None      MDM   Final diagnoses:  Pain in right testicle  Hydrocele of testis    30 yo M with a chief complaint of right testicular pain. Will obtain ultrasound to rule out torsion.  Ultrasound with small right hydrocele. No signs of torsion. Discharge home.  3:16 PM:  I have discussed the diagnosis/risks/treatment options with the patient and family and believe the pt to be eligible for discharge home to follow-up with PCP. We also discussed returning to the ED immediately if new or worsening sx occur. We discussed the sx which are most concerning (e.g., sudden worsening pain, fever, inability to tolerate by mouth) that necessitate immediate return. Medications administered to the patient during their visit and any new prescriptions provided to the patient are listed below.  Medications given during this visit Medications - No data to display  New Prescriptions   No medications on file    The patient appears reasonably screen and/or stabilized for discharge and I doubt any other medical condition or other [facility] requiring further screening, evaluation, or treatment in the ED at this time prior to discharge.    Melene Plan, DO 07/02/15 1516

## 2015-07-02 NOTE — ED Notes (Signed)
Right sided testicle and groin pain that started this morning.  Denies urinary symptoms.  Denies fever.

## 2015-07-02 NOTE — ED Notes (Signed)
Pt h/o heart attack 2 years ago, last week, was cleared by cariologist with 100% cardiac function.  Last week, pt had a spell of dizziness, was seen by PMD and everything checked out ok, but pt describes as a "stressful event".  This morning, pt awoke with severe right testicular pain.

## 2015-07-02 NOTE — Discharge Instructions (Signed)
Hydrocele, Adult  A hydrocele is a collection of fluid in the loose pouch of skin that holds the testicles (scrotum). Usually, it affects only one testicle.  CAUSES  This condition may be caused by:  · An injury to the scrotum.  · An infection.  · A tumor or cancer of the testicle.  · Twisting of a testicle.  · Decreased blood flow to the scrotum.  SYMPTOMS  A hydrocele feels like a water-filled balloon. It may also feel heavy. A hydrocele can cause:  · Swelling of the scrotum. The swelling may decrease when you lie down.  · Swelling of the groin.  · Mild discomfort in the scrotum.  · Pain. This can develop if the hydrocele was caused by infection or twisting.  DIAGNOSIS  This condition may be diagnosed with a medical history, physical exam, and imaging tests. You may also have blood and urine tests to check for infection.  TREATMENT  Treatment may include:  · Watching and waiting, particularly if the hydrocele causes no symptoms.  · Treatment of the underlying condition. This may include using antibiotic medicine.  · Surgery to drain the fluid. Some surgical options include:    Needle aspiration. For this procedure, a needle is used to drain fluid.    Hydrocelectomy. For this procedure, an incision is made in the scrotum to remove the fluid sac.  HOME CARE INSTRUCTIONS  · Keep all follow-up visits as told by your health care provider. This is important.  · Watch the hydrocele for any changes.  · Take over-the-counter and prescription medicines only as told by your health care provider.  · If you were prescribed an antibiotic medicine, use it as told by your health care provider. Do not stop using the antibiotic even if your condition improves.  SEEK MEDICAL CARE IF:  · The swelling in your scrotum or groin gets worse.  · The hydrocele becomes red, firm, tender to the touch, or painful.  · You notice any changes in the hydrocele.  · You have a fever.     This information is not intended to replace advice given to  you by your health care provider. Make sure you discuss any questions you have with your health care provider.     Document Released: 10/25/2009 Document Revised: 09/21/2014 Document Reviewed: 05/03/2014  Elsevier Interactive Patient Education ©2016 [employer]

## 2015-07-04 ENCOUNTER — Emergency Department (HOSPITAL_BASED_OUTPATIENT_CLINIC_OR_DEPARTMENT_OTHER)
Admission: EM | Admit: 2015-07-04 | Discharge: 2015-07-04 | Disposition: A | Discharge status: Home / Self Care | Payer: BLUE CROSS/BLUE SHIELD | Attending: Emergency Medicine | Admitting: Emergency Medicine

## 2015-07-04 ENCOUNTER — Encounter (HOSPITAL_BASED_OUTPATIENT_CLINIC_OR_DEPARTMENT_OTHER): Payer: Self-pay

## 2015-07-04 DIAGNOSIS — N50811 Right testicular pain: Secondary | ICD-10-CM | POA: Insufficient documentation

## 2015-07-04 DIAGNOSIS — R109 Unspecified abdominal pain: Secondary | ICD-10-CM | POA: Diagnosis not present

## 2015-07-04 DIAGNOSIS — K5900 Constipation, unspecified: Secondary | ICD-10-CM | POA: Diagnosis not present

## 2015-07-04 DIAGNOSIS — Z7982 Long term (current) use of aspirin: Secondary | ICD-10-CM | POA: Insufficient documentation

## 2015-07-04 DIAGNOSIS — Z79899 Other long term (current) drug therapy: Secondary | ICD-10-CM | POA: Insufficient documentation

## 2015-07-04 DIAGNOSIS — I2510 Atherosclerotic heart disease of native coronary artery without angina pectoris: Secondary | ICD-10-CM | POA: Diagnosis not present

## 2015-07-04 DIAGNOSIS — E785 Hyperlipidemia, unspecified: Secondary | ICD-10-CM | POA: Diagnosis not present

## 2015-07-04 DIAGNOSIS — Z9861 Coronary angioplasty status: Secondary | ICD-10-CM | POA: Diagnosis not present

## 2015-07-04 DIAGNOSIS — R634 Abnormal weight loss: Secondary | ICD-10-CM | POA: Insufficient documentation

## 2015-07-04 DIAGNOSIS — R591 Generalized enlarged lymph nodes: Secondary | ICD-10-CM

## 2015-07-04 DIAGNOSIS — Z9889 Other specified postprocedural states: Secondary | ICD-10-CM | POA: Insufficient documentation

## 2015-07-04 DIAGNOSIS — R590 Localized enlarged lymph nodes: Secondary | ICD-10-CM | POA: Insufficient documentation

## 2015-07-04 DIAGNOSIS — R509 Fever, unspecified: Secondary | ICD-10-CM | POA: Insufficient documentation

## 2015-07-04 DIAGNOSIS — Z880 Allergy status to penicillin: Secondary | ICD-10-CM | POA: Diagnosis not present

## 2015-07-04 LAB — CBC WITH DIFFERENTIAL/PLATELET
Basophils Absolute: 0 10*3/uL (ref 0.0–0.1)
Basophils Relative: 0 %
EOS ABS: 0.2 10*3/uL (ref 0.0–0.7)
Eosinophils Relative: 1 %
HCT: 44.1 % (ref 39.0–52.0)
HEMOGLOBIN: 14.8 g/dL (ref 13.0–17.0)
LYMPHS ABS: 2.5 10*3/uL (ref 0.7–4.0)
LYMPHS PCT: 17 %
MCH: 29.4 pg (ref 26.0–34.0)
MCHC: 33.6 g/dL (ref 30.0–36.0)
MCV: 87.5 fL (ref 78.0–100.0)
Monocytes Absolute: 0.9 10*3/uL (ref 0.1–1.0)
Monocytes Relative: 6 %
NEUTROS ABS: 11.1 10*3/uL — AB (ref 1.7–7.7)
NEUTROS PCT: 76 %
Platelets: 243 10*3/uL (ref 150–400)
RBC: 5.04 MIL/uL (ref 4.22–5.81)
RDW: 14.3 % (ref 11.5–15.5)
WBC: 14.7 10*3/uL — AB (ref 4.0–10.5)

## 2015-07-04 LAB — COMPREHENSIVE METABOLIC PANEL
ALK PHOS: 54 U/L (ref 38–126)
ALT: 48 U/L (ref 17–63)
AST: 25 U/L (ref 15–41)
Albumin: 4.4 g/dL (ref 3.5–5.0)
Anion gap: 12 (ref 5–15)
BUN: 10 mg/dL (ref 6–20)
CALCIUM: 9.2 mg/dL (ref 8.9–10.3)
CO2: 22 mmol/L (ref 22–32)
CREATININE: 0.59 mg/dL — AB (ref 0.61–1.24)
Chloride: 105 mmol/L (ref 101–111)
GFR calc non Af Amer: >60 mL/min (ref >60)
Glucose, Bld: 118 mg/dL — ABNORMAL HIGH (ref 65–99)
Potassium: 4.2 mmol/L (ref 3.5–5.1)
SODIUM: 139 mmol/L (ref 135–145)
Total Bilirubin: 0.9 mg/dL (ref 0.3–1.2)
Total Protein: 7 g/dL (ref 6.5–8.1)

## 2015-07-04 LAB — URIC ACID: URIC ACID, SERUM: 4.1 mg/dL — AB (ref 4.4–7.6)

## 2015-07-04 LAB — LACTATE DEHYDROGENASE: LDH: 141 U/L (ref 98–192)

## 2015-07-04 LAB — GC/CHLAMYDIA PROBE AMP (~~LOC~~) NOT AT ARMC
Chlamydia: NEGATIVE
Neisseria Gonorrhea: NEGATIVE

## 2015-07-04 NOTE — ED Notes (Addendum)
Patient here with ongoing groin swelling and discomfort. Seen on Saturday for same. Reports that his abdomen feels tender and distended. Has noticed swollen areas to back of neck and throat. He thinks something else is going on, reports seen at [facility] and told he has high WBC but not treated

## 2015-07-04 NOTE — ED Provider Notes (Signed)
CSN: [id number]     Arrival date & time 07/04/15  0919 History   First MD Initiated Contact with Patient 07/04/15 [phone]     No chief complaint on file.    (Consider location/radiation/quality/duration/timing/severity/associated sxs/prior Treatment) Patient is a 30 y.o. male presenting with general illness. The history is provided by the patient and a relative.  Illness Severity:  Moderate Onset quality:  Sudden Duration:  1 month Timing:  Constant Progression:  Worsening Chronicity:  New Associated symptoms: abdominal pain and fever   Associated symptoms: no chest pain, no congestion, no diarrhea, no headaches, no myalgias, no rash, no shortness of breath and no vomiting    30 yo M with a chief complaints of subjective fevers chills night sweats and lymphadenopathy. This been going on for the past month. Patient is concerned that he has lymphoma. Has a family member that has a diagnosis of lymphoma in the past. Patient has been constipated and having some mild diffuse abdominal pain as well. Was seen in the ED a couple days ago with right testicular pain and found to have a right hydrocele.   Past Medical History  Diagnosis Date  . Hyperlipidemia   . Ischemic cardiomyopathy     Ejection fraction of 40-45% due to anterior myocardial infarction  . Coronary artery disease    Past Surgical History  Procedure Laterality Date  . Cardiac catheterization    . Coronary angioplasty    . Left heart catheterization with coronary angiogram N/A 06/14/2013    Procedure: LEFT HEART CATHETERIZATION WITH CORONARY ANGIOGRAM;  Surgeon: Iran Ouch, MD;  Location: [facility] CATH LAB;  Service: Cardiovascular;  Laterality: N/A;  . Percutaneous coronary stent intervention (pci-s)  06/14/2013    Procedure: PERCUTANEOUS CORONARY STENT INTERVENTION (PCI-S);  Surgeon: Iran Ouch, MD;  Location: [facility] CATH LAB;  Service: Cardiovascular;;  mid LAD 2DES placed   Family History  Problem Relation Age of Onset   . Diabetes Paternal Grandmother   . Coronary artery disease Paternal Grandmother   . Healthy Mother   . Healthy Father    Social History  Substance Use Topics  . Smoking status: Never Smoker   . Smokeless tobacco: Never Used  . Alcohol Use: 0.0 oz/week    0 Standard drinks or equivalent per week    Review of Systems  Constitutional: Positive for fever, chills and unexpected weight change.       Night sweats  HENT: Negative for congestion and facial swelling.   Eyes: Negative for discharge and visual disturbance.  Respiratory: Negative for shortness of breath.   Cardiovascular: Negative for chest pain and palpitations.  Gastrointestinal: Positive for abdominal pain and constipation. Negative for vomiting and diarrhea.  Genitourinary: Positive for testicular pain.  Musculoskeletal: Negative for myalgias and arthralgias.  Skin: Negative for color change and rash.  Neurological: Negative for tremors, syncope and headaches.  Psychiatric/Behavioral: Negative for confusion and dysphoric mood.      Allergies  Penicillins  Home Medications   Prior to Admission medications   Medication Sig Start Date End Date Taking? Authorizing Provider  aspirin EC 81 MG EC tablet Take 1 tablet (81 mg total) by mouth daily. 06/17/13   Dwana Melena, PA-C  atorvastatin (LIPITOR) 10 MG tablet TAKE 1 TABLET BY MOUTH DAILY 03/03/15   Iran Ouch, MD  lisinopril (PRINIVIL,ZESTRIL) 5 MG tablet TAKE 1 TABLET BY MOUTH DAILY 07/19/14   Iran Ouch, MD  metoprolol tartrate (LOPRESSOR) 25 MG tablet TAKE 1 TABLET BY  MOUTH TWICE DAILY 03/03/15   Iran Ouch, MD  Multiple Vitamin (MULTI-VITAMINS) TABS Take 1 tablet by mouth daily.    Historical Provider, MD  nitroGLYCERIN (NITROSTAT) 0.4 MG SL tablet Place 1 tablet (0.4 mg total) under the tongue every 5 (five) minutes as needed for chest pain. 06/17/13   Dwana Melena, PA-C  prasugrel (EFFIENT) 10 MG TABS tablet Take 10 mg by mouth daily.     Historical Provider, MD   BP 108/68 mmHg  Pulse 73  Temp(Src) 98.8 F (37.1 C) (Oral)  Resp 18  Wt 150 lb (68.04 kg)  SpO2 100% Physical Exam  Constitutional: He is oriented to person, place, and time. He appears well-developed and well-nourished.  HENT:  Head: Normocephalic and atraumatic.  Tender lymph node to the posterior cervical chain on both the right and the left side.  Tender lymph node also noted to the right inguinal region.  Eyes: EOM are normal. Pupils are equal, round, and reactive to light.  Neck: Normal range of motion. Neck supple. No JVD present.  Cardiovascular: Normal rate and regular rhythm.  Exam reveals no gallop and no friction rub.   No murmur heard. Pulmonary/Chest: No respiratory distress. He has no wheezes.  Abdominal: He exhibits no distension. There is no tenderness. There is no rebound and no guarding.  Musculoskeletal: Normal range of motion.  Neurological: He is alert and oriented to person, place, and time.  Skin: No rash noted. No pallor.  Psychiatric: He has a normal mood and affect. His behavior is normal.  Nursing note and vitals reviewed.   ED Course  Procedures (including critical care time) Labs Review Labs Reviewed  CBC WITH DIFFERENTIAL/PLATELET - Abnormal; Notable for the following:    WBC 14.7 (*)    Neutro Abs 11.1 (*)    All other components within normal limits  COMPREHENSIVE METABOLIC PANEL - Abnormal; Notable for the following:    Glucose, Bld 118 (*)    Creatinine, Ser 0.59 (*)    All other components within normal limits  URIC ACID - Abnormal; Notable for the following:    Uric Acid, Serum 4.1 (*)    All other components within normal limits  LACTATE DEHYDROGENASE    Imaging Review US Scrotum  07/02/2015  CLINICAL DATA:  30 year old male with acute right testicular pain today. EXAM: SCROTAL ULTRASOUND DOPPLER ULTRASOUND OF THE TESTICLES TECHNIQUE: Complete ultrasound examination of the testicles, epididymis, and  other scrotal structures was performed. Color and spectral Doppler ultrasound were also utilized to evaluate blood flow to the testicles. COMPARISON:  None. FINDINGS: Right testicle Measurements: 4.7 x 2.2 x 3.3 cm. No mass or microlithiasis visualized. Left testicle Measurements: 4.5 x 2.4 x 3.3 cm. No mass or microlithiasis visualized. Right epididymis:  A 3 mm cyst/spermatocele noted. Left epididymis:  A 4 mm cyst/ spermatocele noted. Hydrocele:  Small right hydrocele noted. Varicocele:  None visualized. Pulsed Doppler interrogation of both testes demonstrates normal low resistance arterial and venous waveforms bilaterally. IMPRESSION: Normal testicles.  No evidence of testicular torsion. Small right hydrocele. Electronically Signed   By: Harmon Pier M.D.   On: 07/02/2015 14:54   [place] Art/ven Flow Abd Pelv Doppler  07/02/2015  CLINICAL DATA:  30 year old male with acute right testicular pain today. EXAM: SCROTAL ULTRASOUND DOPPLER ULTRASOUND OF THE TESTICLES TECHNIQUE: Complete ultrasound examination of the testicles, epididymis, and other scrotal structures was performed. Color and spectral Doppler ultrasound were also utilized to evaluate blood flow to the testicles. COMPARISON:  None. FINDINGS: Right testicle Measurements: 4.7 x 2.2 x 3.3 cm. No mass or microlithiasis visualized. Left testicle Measurements: 4.5 x 2.4 x 3.3 cm. No mass or microlithiasis visualized. Right epididymis:  A 3 mm cyst/spermatocele noted. Left epididymis:  A 4 mm cyst/ spermatocele noted. Hydrocele:  Small right hydrocele noted. Varicocele:  None visualized. Pulsed Doppler interrogation of both testes demonstrates normal low resistance arterial and venous waveforms bilaterally. IMPRESSION: Normal testicles.  No evidence of testicular torsion. Small right hydrocele. Electronically Signed   By: Harmon Pier M.D.   On: 07/02/2015 14:54   I have personally reviewed and evaluated these images and lab results as part of my medical  decision-making.   EKG Interpretation None      MDM   Final diagnoses:  Lymphadenopathy    30 yo M with a chief complaint of lymphadenopathy. Patient appears mildly anxious. Pointed to his C7 vertebrae and told me he had a tender lymph node there. Only palpable lymph nodes were in the posterior cervical chain as well as the right inguinal region. Do not feel like these are significantly enlarged and are mildly tender. Will obtain a basic laboratory evaluation to evaluate for decreased cell lines. Discussed with the patient that he likely needs to follow-up with the family physician and if they're concerned will likely need to do a biopsy to fully rule this out.    I have discussed the diagnosis/risks/treatment options with the patient and family and believe the pt to be eligible for discharge home to follow-up with PCP. We also discussed returning to the ED immediately if new or worsening sx occur. We discussed the sx which are most concerning (e.g., sudden worsening pain, fever, inability to tolerate by mouth) that necessitate immediate return. Medications administered to the patient during their visit and any new prescriptions provided to the patient are listed below.  Medications given during this visit Medications - No data to display  Discharge Medication List as of 07/04/2015 11:17 AM      The patient appears reasonably screen and/or stabilized for discharge and I doubt any other medical condition or other [facility] requiring further screening, evaluation, or treatment in the ED at this time prior to discharge.    Melene Plan, DO 07/04/15 1447

## 2015-07-04 NOTE — Discharge Instructions (Signed)

## 2015-07-04 NOTE — ED Notes (Signed)
MD at bedside. 

## 2015-07-05 ENCOUNTER — Encounter: Payer: Self-pay | Admitting: Physician Assistant

## 2015-07-05 ENCOUNTER — Ambulatory Visit (INDEPENDENT_AMBULATORY_CARE_PROVIDER_SITE_OTHER): Payer: BLUE CROSS/BLUE SHIELD | Admitting: Physician Assistant

## 2015-07-05 ENCOUNTER — Other Ambulatory Visit (HOSPITAL_COMMUNITY): Payer: Self-pay | Admitting: Urology

## 2015-07-05 VITALS — BP 99/60 | HR 62 | Temp 98.3°F | Ht 67.0 in | Wt 150.4 lb

## 2015-07-05 DIAGNOSIS — S3739XA Other injury of urethra, initial encounter: Secondary | ICD-10-CM | POA: Diagnosis not present

## 2015-07-05 DIAGNOSIS — S3730XA Unspecified injury of urethra, initial encounter: Secondary | ICD-10-CM

## 2015-07-05 DIAGNOSIS — D72829 Elevated white blood cell count, unspecified: Secondary | ICD-10-CM | POA: Diagnosis not present

## 2015-07-05 DIAGNOSIS — R599 Enlarged lymph nodes, unspecified: Secondary | ICD-10-CM

## 2015-07-05 DIAGNOSIS — R590 Localized enlarged lymph nodes: Secondary | ICD-10-CM

## 2015-07-05 LAB — SEDIMENTATION RATE: Sed Rate: 4 mm/hr (ref 0–22)

## 2015-07-05 LAB — CBC WITH DIFFERENTIAL/PLATELET
BASOS ABS: 0.1 10*3/uL (ref 0.0–0.1)
Basophils Relative: 0.6 % (ref 0.0–3.0)
EOS ABS: 0.2 10*3/uL (ref 0.0–0.7)
EOS PCT: 1.4 % (ref 0.0–5.0)
HEMATOCRIT: 44.5 % (ref 39.0–52.0)
Hemoglobin: 15 g/dL (ref 13.0–17.0)
LYMPHS ABS: 2.6 10*3/uL (ref 0.7–4.0)
LYMPHS PCT: 23 % (ref 12.0–46.0)
MCHC: 33.7 g/dL (ref 30.0–36.0)
MCV: 88.7 fl (ref 78.0–100.0)
MONOS PCT: 8.2 % (ref 3.0–12.0)
Monocytes Absolute: 0.9 10*3/uL (ref 0.1–1.0)
NEUTROS PCT: 66.8 % (ref 43.0–77.0)
Neutro Abs: 7.4 10*3/uL (ref 1.4–7.7)
PLATELETS: 260 10*3/uL (ref 150.0–400.0)
RBC: 5.02 Mil/uL (ref 4.22–5.81)
RDW: 14.7 % (ref 11.5–15.5)
WBC: 11.1 10*3/uL — ABNORMAL HIGH (ref 4.0–10.5)

## 2015-07-05 LAB — POC URINALSYSI DIPSTICK (AUTOMATED)
BILIRUBIN UA: NEGATIVE
GLUCOSE UA: NEGATIVE
KETONES UA: NEGATIVE
Leukocytes, UA: NEGATIVE
Nitrite, UA: NEGATIVE
Protein, UA: NEGATIVE
RBC UA: NEGATIVE
SPEC GRAV UA: 1.02
Urobilinogen, UA: 0.2
pH, UA: 7

## 2015-07-05 LAB — COMPREHENSIVE METABOLIC PANEL
ALK PHOS: 50 U/L (ref 39–117)
ALT: 44 U/L (ref 0–53)
AST: 19 U/L (ref 0–37)
Albumin: 4.6 g/dL (ref 3.5–5.2)
BILIRUBIN TOTAL: 0.6 mg/dL (ref 0.2–1.2)
BUN: 11 mg/dL (ref 6–23)
CO2: 30 meq/L (ref 19–32)
CREATININE: 0.89 mg/dL (ref 0.40–1.50)
Calcium: 9.5 mg/dL (ref 8.4–10.5)
Chloride: 102 mEq/L (ref 96–112)
GFR: 105.34 mL/min (ref >60.00)
GLUCOSE: 109 mg/dL — AB (ref 70–99)
Potassium: 4.1 mEq/L (ref 3.5–5.1)
Sodium: 136 mEq/L (ref 135–145)
TOTAL PROTEIN: 7.2 g/dL (ref 6.0–8.3)

## 2015-07-05 NOTE — Patient Instructions (Signed)
Please go to the lab for blood work. I will call with your results. Go downstairs for a chest x-ray. I will call with these results as well.   We will treat based on results as I cannot visualize an infection on exam and your urine is normal. The lymph nodes could be chronic from the history of hidradenitis suppurative but I am looking into things in more depth.  I will call you later today with results and next steps.

## 2015-07-05 NOTE — Progress Notes (Signed)
Patient presents to clinic today c/o penile and R groin pain x 4 days after "intense intercourse" with possible injury to penis. Denies penile discharge, hematuria, dysuria or other urinary symptoms. Notes ED since that time. Notes pain radiating into R testicle. Went to ER on 07/02/15 where US performed and negative. Patient was discharged home. Returned to ER on 07/04/15 with same symptoms and with mention of night sweats over the past 2 months. Denies fever or unexplained weight loss. Labs obtained including CBC and Urine Gc/chalmydia. Urine testing negative. CBC revealed leukocytosis with increase in total neutrophil count.  Patient endorses history of hidradenitis suppurative in the groin but denies current infection. Has noted enlarged lymph nodes in the inguinal region bilaterally.   Past Medical History  Diagnosis Date  . Hyperlipidemia   . Ischemic cardiomyopathy     Ejection fraction of 40-45% due to anterior myocardial infarction  . Coronary artery disease     Current Outpatient Prescriptions on File Prior to Visit  Medication Sig Dispense Refill  . aspirin EC 81 MG EC tablet Take 1 tablet (81 mg total) by mouth daily.    Marland Kitchen atorvastatin (LIPITOR) 10 MG tablet TAKE 1 TABLET BY MOUTH DAILY 30 tablet 6  . lisinopril (PRINIVIL,ZESTRIL) 5 MG tablet TAKE 1 TABLET BY MOUTH DAILY 90 tablet 3  . metoprolol tartrate (LOPRESSOR) 25 MG tablet TAKE 1 TABLET BY MOUTH TWICE DAILY 60 tablet 6  . Multiple Vitamin (MULTI-VITAMINS) TABS Take 1 tablet by mouth daily.    . nitroGLYCERIN (NITROSTAT) 0.4 MG SL tablet Place 1 tablet (0.4 mg total) under the tongue every 5 (five) minutes as needed for chest pain. 25 tablet 12  . prasugrel (EFFIENT) 10 MG TABS tablet Take 10 mg by mouth daily.     No current facility-administered medications on file prior to visit.    Allergies  Allergen Reactions  . Penicillins Other (See Comments)    Unknown Childhood reaction    Family History  Problem  Relation Age of Onset  . Diabetes Paternal Grandmother   . Coronary artery disease Paternal Grandmother   . Healthy Mother   . Healthy Father     Social History   Social History  . Marital Status: Single    Spouse Name: N/A  . Number of Children: N/A  . Years of Education: N/A   Social History Main Topics  . Smoking status: Never Smoker   . Smokeless tobacco: Never Used  . Alcohol Use: 0.0 oz/week    0 Standard drinks or equivalent per week  . Drug Use: No  . Sexual Activity: Yes   Other Topics Concern  . None   Social History Narrative   Review of Systems  Constitutional: Positive for malaise/fatigue. Negative for fever.       + Night sweats  Respiratory: Negative for cough, hemoptysis, sputum production, shortness of breath and wheezing.   Cardiovascular: Negative for chest pain and palpitations.  Gastrointestinal: Negative for heartburn, nausea, vomiting, abdominal pain, diarrhea, constipation, blood in stool and melena.  Genitourinary: Negative for dysuria, urgency, frequency, hematuria and flank pain.       Penile and R testicular pain Endorses lymph nodes   Neurological: Negative for dizziness and loss of consciousness.     BP 99/60 mmHg  Pulse 62  Temp(Src) 98.3 F (36.8 C) (Oral)  Ht '5\' 7"'  (1.702 m)  Wt 150 lb 6.4 oz (68.221 kg)  BMI 23.55 kg/m2  SpO2 100%  Physical Exam  Constitutional: He  is oriented to person, place, and time and well-developed, well-nourished, and in no distress.  HENT:  Head: Normocephalic and atraumatic.  Right Ear: External ear normal.  Left Ear: External ear normal.  Nose: Nose normal.  Mouth/Throat: Oropharynx is clear and moist. No oropharyngeal exudate.  Cardiovascular: Normal rate, regular rhythm, normal heart sounds and intact distal pulses.   Pulmonary/Chest: Effort normal and breath sounds normal. No respiratory distress. He has no wheezes. He has no rales. He exhibits no tenderness.  Abdominal: Soft. Bowel sounds  are normal. He exhibits no distension and no mass. There is no tenderness. There is no rebound and no guarding.  Genitourinary: Testes/scrotum normal and penis normal. No discharge found.  Lymphadenopathy:       Head (right side): No submental, no submandibular, no tonsillar, no preauricular, no posterior auricular and no occipital adenopathy present.       Head (left side): No submental, no submandibular, no tonsillar, no preauricular, no posterior auricular and no occipital adenopathy present.    He has cervical adenopathy.       Right cervical: No superficial cervical, no deep cervical and no posterior cervical adenopathy present.      Left cervical: No superficial cervical, no deep cervical and no posterior cervical adenopathy present.    He has no axillary adenopathy.       Right: No epitrochlear adenopathy present.       Left: Inguinal adenopathy present. No epitrochlear adenopathy present.  Neurological: He is alert and oriented to person, place, and time.  Skin: Skin is warm and dry.  Psychiatric: Affect normal.  Vitals reviewed.   Recent Results (from the past 2160 hour(s))  GC/Chlamydia probe amp ()not at [facility]     Status: None   Collection Time: 07/02/15 12:00 AM  Result Value Ref Range   Chlamydia Negative     Comment: Normal Reference Range - Negative   Neisseria gonorrhea Negative     Comment: Normal Reference Range - Negative  Urinalysis, Routine w reflex microscopic     Status: Abnormal   Collection Time: 07/02/15  3:01 PM  Result Value Ref Range   Color, Urine YELLOW YELLOW   APPearance TURBID (A) CLEAR   Specific Gravity, Urine 1.017 1.005 - 1.030   pH 7.0 5.0 - 8.0   Glucose, UA NEGATIVE NEGATIVE mg/dL   Hgb urine dipstick NEGATIVE NEGATIVE   Bilirubin Urine NEGATIVE NEGATIVE   Ketones, ur NEGATIVE NEGATIVE mg/dL   Protein, ur NEGATIVE NEGATIVE mg/dL   Nitrite NEGATIVE NEGATIVE   Leukocytes, UA NEGATIVE NEGATIVE  Urine microscopic-add on     Status:  Abnormal   Collection Time: 07/02/15  3:01 PM  Result Value Ref Range   Squamous Epithelial / LPF 0-5 (A) NONE SEEN   WBC, UA NONE SEEN 0 - 5 WBC/hpf   RBC / HPF NONE SEEN 0 - 5 RBC/hpf   Bacteria, UA FEW (A) NONE SEEN   Urine-Other MUCOUS PRESENT     Comment: AMORPHOUS URATES/PHOSPHATES  CBC with Differential     Status: Abnormal   Collection Time: 07/04/15 10:00 AM  Result Value Ref Range   WBC 14.7 (H) 4.0 - 10.5 K/uL   RBC 5.04 4.22 - 5.81 MIL/uL   Hemoglobin 14.8 13.0 - 17.0 g/dL   HCT 44.1 39.0 - 52.0 %   MCV 87.5 78.0 - 100.0 fL   MCH 29.4 26.0 - 34.0 pg   MCHC 33.6 30.0 - 36.0 g/dL   RDW 14.3 11.5 - 15.5 %  Platelets 243 150 - 400 K/uL   Neutrophils Relative % 76 %   Neutro Abs 11.1 (H) 1.7 - 7.7 K/uL   Lymphocytes Relative 17 %   Lymphs Abs 2.5 0.7 - 4.0 K/uL   Monocytes Relative 6 %   Monocytes Absolute 0.9 0.1 - 1.0 K/uL   Eosinophils Relative 1 %   Eosinophils Absolute 0.2 0.0 - 0.7 K/uL   Basophils Relative 0 %   Basophils Absolute 0.0 0.0 - 0.1 K/uL  Comprehensive metabolic panel     Status: Abnormal   Collection Time: 07/04/15 10:00 AM  Result Value Ref Range   Sodium 139 135 - 145 mmol/L   Potassium 4.2 3.5 - 5.1 mmol/L   Chloride 105 101 - 111 mmol/L   CO2 22 22 - 32 mmol/L   Glucose, Bld 118 (H) 65 - 99 mg/dL   BUN 10 6 - 20 mg/dL   Creatinine, Ser 0.59 (L) 0.61 - 1.24 mg/dL   Calcium 9.2 8.9 - 10.3 mg/dL   Total Protein 7.0 6.5 - 8.1 g/dL   Albumin 4.4 3.5 - 5.0 g/dL   AST 25 15 - 41 U/L   ALT 48 17 - 63 U/L   Alkaline Phosphatase 54 38 - 126 U/L   Total Bilirubin 0.9 0.3 - 1.2 mg/dL   GFR calc non Af Amer >60 >60 mL/min   GFR calc Af Amer >60 >60 mL/min    Comment: (NOTE) The eGFR has been calculated using the CKD EPI equation. This calculation has not been validated in all clinical situations. eGFR's persistently <60 mL/min signify possible Chronic Kidney Disease.    Anion gap 12 5 - 15  Lactate dehydrogenase     Status: None    Collection Time: 07/04/15 10:00 AM  Result Value Ref Range   LDH 141 98 - 192 U/L    Comment: Performed at [facility]  Uric acid     Status: Abnormal   Collection Time: 07/04/15 10:00 AM  Result Value Ref Range   Uric Acid, Serum 4.1 (L) 4.4 - 7.6 mg/dL  POCT Urinalysis Dipstick (Automated)     Status: None   Collection Time: 07/05/15  8:25 AM  Result Value Ref Range   Color, UA yellow    Clarity, UA clear    Glucose, UA neg    Bilirubin, UA neg    Ketones, UA neg    Spec Grav, UA 1.020    Blood, UA neg    pH, UA 7.0    Protein, UA neg    Urobilinogen, UA 0.2    Nitrite, UA neg    Leukocytes, UA Negative Negative    Assessment/Plan: Injury of penile urethra Referral to Urology placed given negative ER workup and history of injury with new onset ED. Again US revealed only small R hydrocele. Urine GC/chalmydia negative.  Leukocytosis Unclear etiology. Patient states this has been present for some time but cannot elaborate further. Will repeat CBC with diff, CMP, ESR, Urine culture and CXR. There is lymphadenopathy of bilateral inguinal regions so HIV ab and RPR added. This adenopathy may be chronic from chronic hidradenitis suppurativa. No active infection noted on exam. If all labs unremarkable but WBC elevation persists will refer to Hem/Onc. A referral to Urology also placed. Patient has appointment today.

## 2015-07-05 NOTE — Progress Notes (Signed)
Pre visit review using our clinic review tool, if applicable. No additional management support is needed unless otherwise documented below in the visit note. 

## 2015-07-05 NOTE — Assessment & Plan Note (Signed)
Referral to Urology placed given negative ER workup and history of injury with new onset ED. Again US revealed only small R hydrocele. Urine GC/chalmydia negative.

## 2015-07-05 NOTE — Assessment & Plan Note (Signed)
Unclear etiology. Patient states this has been present for some time but cannot elaborate further. Will repeat CBC with diff, CMP, ESR, Urine culture and CXR. There is lymphadenopathy of bilateral inguinal regions so HIV ab and RPR added. This adenopathy may be chronic from chronic hidradenitis suppurativa. No active infection noted on exam. If all labs unremarkable but WBC elevation persists will refer to Hem/Onc. A referral to Urology also placed. Patient has appointment today.

## 2015-07-06 ENCOUNTER — Ambulatory Visit (HOSPITAL_COMMUNITY): Admission: RE | Admit: 2015-07-06 | Payer: BLUE CROSS/BLUE SHIELD | Source: Ambulatory Visit

## 2015-07-06 LAB — CULTURE, URINE COMPREHENSIVE
COLONY COUNT: NO GROWTH
Organism ID, Bacteria: NO GROWTH

## 2015-07-06 LAB — HIV ANTIBODY (ROUTINE TESTING W REFLEX): HIV 1&2 Ab, 4th Generation: NONREACTIVE

## 2015-07-06 LAB — RPR

## 2015-07-12 ENCOUNTER — Other Ambulatory Visit: Payer: Self-pay | Admitting: Cardiovascular Disease

## 2015-07-12 NOTE — Progress Notes (Signed)
Cardiology Office Note:    Date:  07/13/2015   ID:  [name], DOB Jan 07, 1986, MRN [id number]  PCP:  Leeanne Rio, PA-C  Cardiologist:  Dr. Kathlyn Sacramento   Electrophysiologist:  n/a  Chief Complaint  Patient presents with  . Chest Pain    History of Present Illness:     [name] is a 30 y.o. male with a hx of CAD, bicuspid aortic valve, HL. He suffered an anterior STEMI in 1/15 treated with aspiration thrombectomy and DES 2 to the LAD. EF was 40-45%. Echocardiogram in 1/17 demonstrated recovered LV function with an EF 55-60%. Last seen by Dr. Fletcher Anon 06/28/15.   Patient was seen in emergency room 07/02/15 with testicular pain. Ultrasound demonstrated right hydrocele but no torsion. His again seen in emergency room 07/04/15 with fever, chills, night sweats and lymphadenopathy. He had limited workup in the emergency room which was unremarkable.  He was seen by his primary care provider 07/05/15. He was referred to urology. He was noted to have bilateral inguinal adenopathy. It was thought that this might be related to chronic hidradenitis suppurativa.  Recent Labs  07/04/15 1000 07/05/15 0832  K 4.2 4.1  CREATININE 0.59* 0.89  AST 25 19  ALT 48 44  PROT 7.0 7.2  ALBUMIN 4.4 4.6  LDH 141  --   CALCIUM 9.2 9.5  WBC 14.7* 11.1*  HGB 14.8 15.0  PLT 243 260.0  NEUTOPHILPCT 76 66.8  LYMPHOPCT 17 23.0  ESR 4 RPR non reactive HIV non reactive  He went to [facility] ED 06/25/15 with symptoms of chest discomfort.  This was an odd sensation. He took ASA and NTG and called EMS.  He was extremely lightheaded before EMS arrived. He had some R arm weakness earlier in the day. He went to a chiropractor earlier that day as well.  He had tingling in his L middle finger and an odd sensation in his L arm. I reviewed the records.  No DC summary available. Troponin levels were neg.  He has subsequently seen urology in [place] and [place].  MRI of the chest and abdomen was recommended by  on physician and CT of the abdomen was recommended by another (to work up adenopathy).  He has not undergone either test yet. He is worried that he had a TIA.  He denies visual loss, speech difficulty or facial droop. He notes decreased energy.  Denies further chest pain. Denies orthopnea, PND, edema. Denies further chest pain. Denies DOE.   Past Medical History  Diagnosis Date  . Hyperlipidemia   . Ischemic cardiomyopathy     Ejection fraction of 40-45% due to anterior myocardial infarction  . Coronary artery disease     Past Surgical History  Procedure Laterality Date  . Cardiac catheterization    . Coronary angioplasty    . Left heart catheterization with coronary angiogram N/A 06/14/2013    Procedure: LEFT HEART CATHETERIZATION WITH CORONARY ANGIOGRAM;  Surgeon: Wellington Hampshire, MD;  Location: [facility] CATH LAB;  Service: Cardiovascular;  Laterality: N/A;  . Percutaneous coronary stent intervention (pci-s)  06/14/2013    Procedure: PERCUTANEOUS CORONARY STENT INTERVENTION (PCI-S);  Surgeon: Wellington Hampshire, MD;  Location: [facility] CATH LAB;  Service: Cardiovascular;;  mid LAD 2DES placed    Current Medications: Outpatient Prescriptions Prior to Visit  Medication Sig Dispense Refill  . aspirin EC 81 MG EC tablet Take 1 tablet (81 mg total) by mouth daily.    Marland Kitchen atorvastatin (LIPITOR) 10  MG tablet TAKE 1 TABLET BY MOUTH DAILY 30 tablet 6  . lisinopril (PRINIVIL,ZESTRIL) 5 MG tablet TAKE 1 TABLET BY MOUTH EVERY DAY 90 tablet 3  . metoprolol tartrate (LOPRESSOR) 25 MG tablet TAKE 1 TABLET BY MOUTH TWICE DAILY 60 tablet 6  . Multiple Vitamin (MULTI-VITAMINS) TABS Take 1 tablet by mouth daily.    . nitroGLYCERIN (NITROSTAT) 0.4 MG SL tablet Place 1 tablet (0.4 mg total) under the tongue every 5 (five) minutes as needed for chest pain. 25 tablet 12  . prasugrel (EFFIENT) 10 MG TABS tablet Take 10 mg by mouth daily. Reported on 07/13/2015     No facility-administered medications prior to visit.      Allergies:   Penicillins   Social History   Social History  . Marital Status: Single    Spouse Name: N/A  . Number of Children: N/A  . Years of Education: N/A   Social History Main Topics  . Smoking status: Never Smoker   . Smokeless tobacco: Never Used  . Alcohol Use: 0.0 oz/week    0 Standard drinks or equivalent per week  . Drug Use: No  . Sexual Activity: Yes   Other Topics Concern  . None   Social History Narrative     Family History:  The patient's family history includes Coronary artery disease in his paternal grandmother; Diabetes in his paternal grandmother; Healthy in his father and mother.   ROS:   Please see the history of present illness.    Review of Systems  Constitution: Positive for diaphoresis.  HENT: Positive for headaches.   Cardiovascular: Positive for chest pain.  Musculoskeletal: Positive for back pain and myalgias.  Gastrointestinal: Positive for constipation.  Psychiatric/Behavioral: The patient is nervous/anxious.   All other systems reviewed and are negative.   Physical Exam:    VS:  BP 100/58 mmHg  Pulse 70  Ht _0  (1.702 m)  Wt 150 lb 12.8 oz (68.402 kg)  BMI 23.61 kg/m2   GEN: Well nourished, well developed, in no acute distress HEENT: normal Neck: no JVD, no masses Cardiac: Normal S1/S2, RRR; no murmurs,   no edema;  ? L carotid bruit; no R carotid bruit   Respiratory:  clear to auscultation bilaterally; no wheezing, rhonchi or rales GI: soft, nontender,  MS: no deformity or atrophy Skin: warm and dry, no rash Neuro:  CN 2-12 intact, Bilateral strength equal, no focal deficits  Psych: Alert and oriented x 3, normal affect   Wt Readings from Last 3 Encounters:  07/13/15 150 lb 12.8 oz (68.402 kg)  07/05/15 150 lb 6.4 oz (68.221 kg)  07/04/15 150 lb (68.04 kg)      Studies/Labs Reviewed:     EKG:  EKG is  ordered today.  The ekg ordered today demonstrates NSR, HR 65, normal axis, septal Q waves, QTc 386 ms, no change  from prior tracing  Recent Labs: 07/05/2015: ALT 44; BUN 11; Creatinine, Ser 0.89; Hemoglobin 15.0; Platelets 260.0; Potassium 4.1; Sodium 136   Recent Lipid Panel    Component Value Date/Time   CHOL 141 12/14/2014 1129   TRIG 158.0* 12/14/2014 1129   HDL 54.80 12/14/2014 1129   CHOLHDL 3 12/14/2014 1129   VLDL 31.6 12/14/2014 1129   LDLCALC 55 12/14/2014 1129    Additional studies/ records that were reviewed today include:   Echo 06/21/15 EF 55-60%, anteroseptal and apical septal hypokinesis, normal diastolic function, bicuspid aortic valve, no aortic stenosis  Holter 2/15 NSR, PVCs, sinus tachy  LHC 1/15 LM okay LAD mid occluded with large thrombus LCx no significant disease RCA no significant disease PCI aspiration thrombectomy +3.25 x 18 mm overlapped with 2.75 x 18 mm Xience DES Final Conclusions:  1. Severe 1v CAD with occluded mid LAD due to what seems to be a plaque rupture. No other obstructive disease is noted. 2. Moderately elevated left ventricular end-diastolic pressure. Mild to moderately reduced LV systolic function by echocardiogram with an EF of 40-45% 3. Successful aspiration thrombectomy and drug-eluting stent placement x2 to the mid LAD. A second stent was placed due to suspected edge dissection.   ASSESSMENT:     1. Paresthesia   2. Bruit of left carotid artery   3. Other chest pain   4. Coronary artery disease involving native coronary artery of native heart without angina pectoris   5. Hyperlipidemia   6. Lymphadenopathy     PLAN:     In order of problems listed above:  1. Paresthesias - He has had a lot of strange sensations.  He is concerned he had a TIA.  Overall, he does not have classic symptoms of a TIA.  He may have a Carotid bruit on the L. Overall, symptoms may be related to anxiety.    -  Will get Head CT without contrast  -  Obtain Carotid US  -  FU with PCP if above neg  2. L carotid bruit - Suspected on exam.  Will get carotid  US.  3. Chest Pain - Atypical. He has been fatigued. He is worried about going back to exercise. Will get ETT.  4. CAD - Prior anterior MI treated with DES x 2 to the LAD.  EF has recovered.  Obtain ETT as noted.  Continue ASA, Effient, statin, beta-blocker, ACE inhibitor.  5. HL - Continue statin.  6. Lymphadenopathy - He has had elevated WBC counts, night sweats, inguinal adenopathy.  I have encouraged him to FU with the urologist to get his abdomen scanned as recommended to rule out underlying malignancy.      Medication Adjustments/Labs and Tests Ordered: Current medicines are reviewed at length with the patient today.  Concerns regarding medicines are outlined above.  Medication changes, Labs and Tests ordered today are outlined in the Patient Instructions noted below. Patient Instructions  Medication Instructions:  Your physician recommends that you continue on your current medications as directed. Please refer to the Current Medication list given to you today.  Labwork: None ordered  Testing/Procedures: Your physician has requested that you have an exercise tolerance test. For further information please visit [email or web address]. Please also follow instruction sheet, as given.  Non-Cardiac CT scanning, (CAT scanning), is a noninvasive, special x-ray that produces cross-sectional images of the body using x-rays and a computer. CT scans help physicians diagnose and treat medical conditions. For some CT exams, a contrast material is used to enhance visibility in the area of the body being studied. CT scans provide greater clarity and reveal more details than regular x-ray exams.  Your physician has requested that you have a carotid duplex. This test is an ultrasound of the carotid arteries in your neck. It looks at blood flow through these arteries that supply the brain with blood. Allow one hour for this exam. There are no restrictions or special instructions.  Follow-Up: Your  physician recommends that you schedule a follow-up appointment WITH DR. Fletcher Anon AS PLANNED  Any Other Special Instructions Will Be Listed Below (If Applicable).   If you need a  refill on your cardiac medications before your next appointment, please call your pharmacy.     Signed, Richardson Dopp, PA-C  07/13/2015 3:10 PM    [facility] [place], [place], [place]  [address] Phone: [phone]; Fax: [phone]

## 2015-07-13 ENCOUNTER — Ambulatory Visit (INDEPENDENT_AMBULATORY_CARE_PROVIDER_SITE_OTHER): Payer: BLUE CROSS/BLUE SHIELD | Admitting: Physician Assistant

## 2015-07-13 ENCOUNTER — Encounter: Payer: Self-pay | Admitting: Physician Assistant

## 2015-07-13 VITALS — BP 100/58 | HR 70 | Ht 67.0 in | Wt 150.8 lb

## 2015-07-13 DIAGNOSIS — Q231 Congenital insufficiency of aortic valve: Secondary | ICD-10-CM | POA: Diagnosis not present

## 2015-07-13 DIAGNOSIS — R0789 Other chest pain: Secondary | ICD-10-CM | POA: Diagnosis not present

## 2015-07-13 DIAGNOSIS — I255 Ischemic cardiomyopathy: Secondary | ICD-10-CM | POA: Diagnosis not present

## 2015-07-13 DIAGNOSIS — E785 Hyperlipidemia, unspecified: Secondary | ICD-10-CM | POA: Diagnosis not present

## 2015-07-13 DIAGNOSIS — R0989 Other specified symptoms and signs involving the circulatory and respiratory systems: Secondary | ICD-10-CM | POA: Diagnosis not present

## 2015-07-13 DIAGNOSIS — I2510 Atherosclerotic heart disease of native coronary artery without angina pectoris: Secondary | ICD-10-CM | POA: Diagnosis not present

## 2015-07-13 DIAGNOSIS — R591 Generalized enlarged lymph nodes: Secondary | ICD-10-CM

## 2015-07-13 DIAGNOSIS — R202 Paresthesia of skin: Secondary | ICD-10-CM | POA: Diagnosis not present

## 2015-07-13 NOTE — Patient Instructions (Addendum)
Medication Instructions:  Your physician recommends that you continue on your current medications as directed. Please refer to the Current Medication list given to you today.  Labwork: None ordered  Testing/Procedures: Your physician has requested that you have an exercise tolerance test. For further information please visit [email or web address] Please also follow instruction sheet, as given.  Non-Cardiac CT scanning, (CAT scanning), is a noninvasive, special x-ray that produces cross-sectional images of the body using x-rays and a computer. CT scans help physicians diagnose and treat medical conditions. For some CT exams, a contrast material is used to enhance visibility in the area of the body being studied. CT scans provide greater clarity and reveal more details than regular x-ray exams.  Your physician has requested that you have a carotid duplex. This test is an ultrasound of the carotid arteries in your neck. It looks at blood flow through these arteries that supply the brain with blood. Allow one hour for this exam. There are no restrictions or special instructions.  Follow-Up: Your physician recommends that you schedule a follow-up appointment WITH DR. Kirke Corin AS PLANNED  Any Other Special Instructions Will Be Listed Below (If Applicable).  Exercise Stress Electrocardiogram -An exercise stress electrocardiogram is a test to check how blood flows to your heart. It is done to find areas of poor blood flow. You will need to walk on a treadmill for this test. The electrocardiogram will record your heartbeat when you are at rest and when you are exercising. BEFORE THE PROCEDURE  Do not have drinks with caffeine or foods with caffeine for 24 hours before the test, or as told by your doctor. This includes coffee, tea (even decaf tea), sodas, chocolate, and cocoa.  Follow your doctor's instructions about eating and drinking before the test.  Ask your doctor what medicines you should or  should not take before the test. Take your medicines with water unless told by your doctor not to.  If you use an inhaler, bring it with you to the test.  Bring a snack to eat after the test.  Do not  smoke for 4 hours before the test.  Do not put lotions, powders, creams, or oils on your chest before the test.  Wear comfortable shoes and clothing. PROCEDURE  You will have patches put on your chest. Small areas of your chest may need to be shaved. Wires will be connected to the patches.  Your heart rate will be watched while you are resting and while you are exercising.  You will walk on the treadmill. The treadmill will slowly get faster to raise your heart rate.  The test will take about 1-2 hours. AFTER THE PROCEDURE  Your heart rate and blood pressure will be watched after the test.  You may return to your normal diet, activities, and medicines or as told by your doctor.   This information is not intended to replace advice given to you by your health care provider. Make sure you discuss any questions you have with your health care provider.   Document Released: 10/24/2007 Document Revised: 05/28/2014 Document Reviewed: 01/12/2013 Elsevier Interactive Patient Education 2016 [employer].  CT Scan A computed tomography (CT) scan is a specialized X-ray scan. It uses X-rays and a computer to make pictures of different areas of your body. A CT scan can offer more detailed information than a regular X-ray exam. The CT scan provides data about internal organs, soft tissue structures, blood vessels, and bones.  -The CT scanner is a large machine  that takes pictures of your body as you move through the opening.  LET [facility] CARE PROVIDER KNOW ABOUT:  Any allergies you have.   All medicines you are taking, including vitamins, herbs, eye drops, creams, and over-the-counter medicines.   Previous problems you or members of your family have had with the use of anesthetics.    Any blood disorders you have.   Previous surgeries you have had.   Medical conditions you have. RISKS AND COMPLICATIONS  Generally, this is a safe procedure. However, as with any procedure, problems can occur. Possible problems include:   An allergic reaction to the contrast material.   Development of cancer from excessive exposure to radiation. The risk of this is small.  BEFORE THE PROCEDURE   The day before the test, stop drinking caffeinated beverages. These include energy drinks, tea, soda, coffee, and hot chocolate.   On the day of the test:  About 4 hours before the test, stop eating and drinking anything but water as advised by your health care provider.   Avoid wearing jewelry. You will have to partly or fully undress and wear a hospital gown. PROCEDURE   You will be asked to lie on a table with your arms above your head.   If contrast dye is to be used for the test, an IV tube will be inserted in your arm. The contrast dye will be injected into the IV tube. You might feel warm, or you may get a metallic taste in your mouth.   The table you will be lying on will move into a large machine that will do the scanning.   You will be able to see, hear, and talk to the person running the machine while you are in it. Follow that person's directions.   The CT machine will move around you to take pictures. Do not move while it is scanning. This helps to get a good image.   When the best possible pictures have been taken, the machine will be turned off. The table will be moved out of the machine. The IV tube will then be removed. AFTER THE PROCEDURE  Ask your health care provider when to follow up for your test results.   This information is not intended to replace advice given to you by your health care provider. Make sure you discuss any questions you have with your health care provider.   Document Released: 06/14/2004 Document Revised: 05/12/2013 Document  Reviewed: 01/12/2013 Elsevier Interactive Patient Education [employer]  If you need a refill on your cardiac medications before your next appointment, please call your pharmacy.

## 2015-07-15 ENCOUNTER — Ambulatory Visit (INDEPENDENT_AMBULATORY_CARE_PROVIDER_SITE_OTHER)
Admission: RE | Admit: 2015-07-15 | Discharge: 2015-07-15 | Disposition: A | Discharge status: Home / Self Care | Payer: BLUE CROSS/BLUE SHIELD | Source: Ambulatory Visit | Attending: Physician Assistant | Admitting: Physician Assistant

## 2015-07-15 DIAGNOSIS — R202 Paresthesia of skin: Secondary | ICD-10-CM

## 2015-07-18 ENCOUNTER — Telehealth: Payer: Self-pay | Admitting: *Deleted

## 2015-07-18 NOTE — Telephone Encounter (Signed)
Pt has been notified of Head CT result normal. Pt said thank you.

## 2015-07-21 ENCOUNTER — Ambulatory Visit (HOSPITAL_BASED_OUTPATIENT_CLINIC_OR_DEPARTMENT_OTHER)
Admission: RE | Admit: 2015-07-21 | Discharge: 2015-07-21 | Disposition: A | Discharge status: Home / Self Care | Payer: BLUE CROSS/BLUE SHIELD | Source: Ambulatory Visit | Attending: Family Medicine | Admitting: Family Medicine

## 2015-07-21 ENCOUNTER — Encounter: Payer: Self-pay | Admitting: Family Medicine

## 2015-07-21 ENCOUNTER — Ambulatory Visit (INDEPENDENT_AMBULATORY_CARE_PROVIDER_SITE_OTHER): Payer: BLUE CROSS/BLUE SHIELD | Admitting: Family Medicine

## 2015-07-21 ENCOUNTER — Telehealth: Payer: Self-pay | Admitting: Physician Assistant

## 2015-07-21 VITALS — BP 114/71 | HR 70 | Temp 98.2°F | Ht 67.0 in | Wt 151.2 lb

## 2015-07-21 DIAGNOSIS — R202 Paresthesia of skin: Secondary | ICD-10-CM | POA: Diagnosis not present

## 2015-07-21 DIAGNOSIS — M549 Dorsalgia, unspecified: Secondary | ICD-10-CM | POA: Insufficient documentation

## 2015-07-21 DIAGNOSIS — D72829 Elevated white blood cell count, unspecified: Secondary | ICD-10-CM

## 2015-07-21 NOTE — Telephone Encounter (Signed)
Patient states that he is having numbness in arms and leg with tingling in hands and feet. Has headache but no vomiting. Transferred to [facility]. Spoke with Onalee Hua

## 2015-07-21 NOTE — Patient Instructions (Signed)
It was nice to meet you today! Please go to lab and then downstairs for x-rays I will be in touch with your labs and x-ray reads asap We will refer you to neurology unless your x-rays suggest another course of action Please keep [place] posted on any changes in your symptoms

## 2015-07-21 NOTE — Progress Notes (Signed)
Pre visit review using our clinic review tool, if applicable. No additional management support is needed unless otherwise documented below in the visit note. 

## 2015-07-21 NOTE — Telephone Encounter (Signed)
Patient Name: [name]  DOB: April 21, 1986    Initial Comment Caller States numbness in arms and legs on both sides. tingling in hand and feet in both sides. been going on for awhile, been seen in ER, cant find out what the issue is. last several days it has become worse.    Nurse Assessment  Nurse: Stefano Gaul, RN, Vera Date/Time [name] Time): 07/21/2015 12:09:12 PM  Confirm and document reason for call. If symptomatic, describe symptoms. You must click the next button to save text entered. ---Caller states he is having numbness in arms and legs on both sides. Has been to the ER and the doctor since the beginning of February. He is having muscle spasms. Has had a headache for a week off and on but not now. Has tingling in his hands and feet. symptoms have worsened over the past few days.  Has the patient traveled out of the country within the last 30 days? ---Not Applicable  Does the patient have any new or worsening symptoms? ---Yes  Will a triage be completed? ---Yes  Related visit to physician within the last 2 weeks? ---No  Does the PT have any chronic conditions? (i.e. diabetes, asthma, etc.) ---No  Is this a behavioral health or substance abuse call? ---No     Guidelines    Guideline Title Affirmed Question Affirmed Notes  Neurologic Deficit Neck pain (and neurologic deficit)    Final Disposition User   See Physician within 4 Hours (or PCP triage) Stefano Gaul, RN, Vera    Comments    appt scheduled for 07/21/15 at 2 pm with Dr. Abbe Amsterdam at [facility]   Referrals  REFERRED TO PCP OFFICE   Disagree/Comply: Comply

## 2015-07-21 NOTE — Progress Notes (Signed)
[facility] [address], [address] [place], [place] [address] [phone]  Date:  07/21/2015   Name:  [name]   DOB:  04-28-1986   MRN:  [id number]  PCP:  Piedad Climes, PA-C    Chief Complaint: Back Pain   History of Present Illness:  [name] is a 30 y.o. very pleasant male patient who presents with the following:  History of STEMI in 2015- EF recovered to normal.  Otherwise his PMHx is unremarkable.  However he has noted some unusual sx over the last month or so, summarized below:   On 06/24/15 he noted an unusual episode while he was exercising- he noted an "overwhelming feeling, my right arm went super numb."  He thought it might be related to his heart- he took aspirin, nitro, called 911.  He was seen in the ER and looked ok but was kept overnight due to some t wave changes and leukocytosis.    Earlier in the day on the 3rd he had gone to a chiropractor for back pain- he is not sure if this is related the incident described above  He has had a couple of other episodes similar to this over the last month.   Then on the 11th of February he was seen in the [facility] ER for groin pain- he had a negative testicular US. He went home but continued to have body aches, some night sweats. He came back on the 13th for sx of more generalized illness. At that time his WU was unremarkable.   He feels like he has alternation of sensation in various places of his body He has had some intermittent paresthesias of his hands and feet, and sometimes "total body tingling"  He also had some constipation for a couple of weeks  He is here today because he feels like his whole body feels strange, "it's tough to describe, the skin is desensitized and I can't really feel like a pinch to the skin on my arms.  The skin on my back and neck, there is just not a whole lot of feeling there at all."  Also feels like his neck and back are"  tightened up."  He feels like the sensation changes in his body will come and go, and also will move around.  He is worried that there is something wrong with his brain or spine that we have not yet ascertained.   He has noted a HA for a week or so  Negative HIV, negative RPR 2 weeks ago  He lives in [place] He denies any unusual PHMx in his family   Patient Active Problem List   Diagnosis Date Noted  . 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 Leukocytosis 07/05/2015  . Bicuspid aortic valve 07/28/2013  . Palpitations 07/03/2013  . Hyperlipidemia   . Ischemic cardiomyopathy   . Coronary artery disease   . History of hidradenitis suppurativa 06/15/2013  . Amphetamine adverse reaction 06/15/2013  . Chest pain 06/14/2013  . NSTEMI (non-ST elevated myocardial infarction) (HCC) 06/14/2013    Past Medical History  Diagnosis Date  . Hyperlipidemia   . Ischemic cardiomyopathy     Ejection fraction of 40-45% due to anterior myocardial infarction  . Coronary artery disease     Past Surgical History  Procedure Laterality Date  . Cardiac catheterization    . Coronary angioplasty    . Left heart catheterization with coronary angiogram N/A  06/14/2013    Procedure: LEFT HEART CATHETERIZATION WITH CORONARY ANGIOGRAM;  Surgeon: Iran Ouch, MD;  Location: [facility] CATH LAB;  Service: Cardiovascular;  Laterality: N/A;  . Percutaneous coronary stent intervention (pci-s)  06/14/2013    Procedure: PERCUTANEOUS CORONARY STENT INTERVENTION (PCI-S);  Surgeon: Iran Ouch, MD;  Location: [facility] CATH LAB;  Service: Cardiovascular;;  mid LAD 2DES placed    Social History  Substance Use Topics  . Smoking status: Never Smoker   . Smokeless tobacco: Never Used  . Alcohol Use: 0.0 oz/week    0 Standard drinks or equivalent per week    Family History  Problem Relation Age of Onset  . Diabetes Paternal Grandmother   . Coronary artery disease Paternal Grandmother   . Healthy Mother   . Healthy  Father     Allergies  Allergen Reactions  . Penicillins Other (See Comments)    Unknown Childhood reaction    Medication list has been reviewed and updated.  Current Outpatient Prescriptions on File Prior to Visit  Medication Sig Dispense Refill  . aspirin EC 81 MG EC tablet Take 1 tablet (81 mg total) by mouth daily.    Marland Kitchen atorvastatin (LIPITOR) 10 MG tablet TAKE 1 TABLET BY MOUTH DAILY 30 tablet 6  . lisinopril (PRINIVIL,ZESTRIL) 5 MG tablet TAKE 1 TABLET BY MOUTH EVERY DAY 90 tablet 3  . metoprolol tartrate (LOPRESSOR) 25 MG tablet TAKE 1 TABLET BY MOUTH TWICE DAILY 60 tablet 6  . Multiple Vitamin (MULTI-VITAMINS) TABS Take 1 tablet by mouth daily.    . nitroGLYCERIN (NITROSTAT) 0.4 MG SL tablet Place 1 tablet (0.4 mg total) under the tongue every 5 (five) minutes as needed for chest pain. 25 tablet 12   No current facility-administered medications on file prior to visit.    Review of Systems:  As per HPI- otherwise negative.   Physical Examination: Filed Vitals:   07/21/15 1400  BP: 114/71  Pulse: 70  Temp: 98.2 F (36.8 C)   Filed Vitals:   07/21/15 1400  Height: 5\' 7"  (1.702 m)  Weight: 151 lb 3.2 oz (68.584 kg)   Body mass index is 23.68 kg/(m^2). Ideal Body Weight: Weight in (lb) to have BMI = 25: 159.3  GEN: WDWN, NAD, Non-toxic, A & O x 3, looks healthy, fit build HEENT: Atraumatic, Normocephalic. Neck supple. No masses, No LAD. Ears and Nose: No external deformity. CV: RRR, No M/G/R. No JVD. No thrill. No extra heart sounds. PULM: CTA B, no wheezes, crackles, rhonchi. No retractions. No resp. distress. No accessory muscle use. ABD: S, NT, ND, +BS. No rebound. No HSM. EXTR: No c/c/e.  Normal strength, DTR, romberg and tandem gain testing.  NEURO Normal gait.  PSYCH: Normally interactive. Conversant. Not depressed or anxious appearing.  Calm demeanor.   Dg Cervical Spine Complete  07/21/2015  CLINICAL DATA:  Numbness and tingling in hands and feet,  impaired sensations for 1 month, BILATERAL back pain of unspecified location, paresthesias EXAM: CERVICAL SPINE - COMPLETE 4+ VIEW COMPARISON:  None FINDINGS: Prevertebral soft tissues normal thickness. Bones appear demineralized. Straightening of cervical lordosis. Vertebral body and disc space heights maintained. Bony neural foramina patent. No acute fracture, subluxation, or bone destruction. Lung apices clear. C1-C2 alignment normal. IMPRESSION: Straightening of cervical lordosis question muscle spasm. Mild osseous demineralization without acute bony abnormalities. Electronically Signed   By: Ulyses Southward M.D.   On: 07/21/2015 16:11   Dg Thoracic Spine 2 View  07/21/2015  CLINICAL DATA:  Bilateral back  pain, paresthesias. EXAM: THORACIC SPINE 2 VIEWS COMPARISON:  June 14, 2013. FINDINGS: There is no evidence of thoracic spine fracture. Alignment is normal. No other significant bone abnormalities are identified. IMPRESSION: No significant abnormality seen in the thoracic spine. Electronically Signed   By: Lupita Raider, M.D.   On: 07/21/2015 16:07   Dg Lumbar Spine 2-3 Views  07/21/2015  CLINICAL DATA:  Back pain EXAM: LUMBAR SPINE - 2-3 VIEW COMPARISON:  None. FINDINGS: Anatomic alignment. No vertebral compression deformity. Mild narrowing of the L4-5 and L5-S1 discs. IMPRESSION: No acute bony pathology. Electronically Signed   By: Jolaine Click M.D.   On: 07/21/2015 16:07   Ct Head Wo Contrast  07/15/2015  CLINICAL DATA:  RIGHT arm paresthesias that last 1 hour. EXAM: CT HEAD WITHOUT CONTRAST TECHNIQUE: Contiguous axial images were obtained from the base of the skull through the vertex without intravenous contrast. COMPARISON:  None. FINDINGS: No evidence for acute infarction, hemorrhage, mass lesion, hydrocephalus, or extra-axial fluid. No atrophy or white matter disease. Intact calvarium. No acute sinus or mastoid disease. IMPRESSION: Negative. Electronically Signed   By: Elsie Stain M.D.   On:  07/15/2015 15:04   US Scrotum  07/02/2015  CLINICAL DATA:  30 year old male with acute right testicular pain today. EXAM: SCROTAL ULTRASOUND DOPPLER ULTRASOUND OF THE TESTICLES TECHNIQUE: Complete ultrasound examination of the testicles, epididymis, and other scrotal structures was performed. Color and spectral Doppler ultrasound were also utilized to evaluate blood flow to the testicles. COMPARISON:  None. FINDINGS: Right testicle Measurements: 4.7 x 2.2 x 3.3 cm. No mass or microlithiasis visualized. Left testicle Measurements: 4.5 x 2.4 x 3.3 cm. No mass or microlithiasis visualized. Right epididymis:  A 3 mm cyst/spermatocele noted. Left epididymis:  A 4 mm cyst/ spermatocele noted. Hydrocele:  Small right hydrocele noted. Varicocele:  None visualized. Pulsed Doppler interrogation of both testes demonstrates normal low resistance arterial and venous waveforms bilaterally. IMPRESSION: Normal testicles.  No evidence of testicular torsion. Small right hydrocele. Electronically Signed   By: Harmon Pier M.D.   On: 07/02/2015 14:54   [place] Art/ven Flow Abd Pelv Doppler  07/02/2015  CLINICAL DATA:  30 year old male with acute right testicular pain today. EXAM: SCROTAL ULTRASOUND DOPPLER ULTRASOUND OF THE TESTICLES TECHNIQUE: Complete ultrasound examination of the testicles, epididymis, and other scrotal structures was performed. Color and spectral Doppler ultrasound were also utilized to evaluate blood flow to the testicles. COMPARISON:  None. FINDINGS: Right testicle Measurements: 4.7 x 2.2 x 3.3 cm. No mass or microlithiasis visualized. Left testicle Measurements: 4.5 x 2.4 x 3.3 cm. No mass or microlithiasis visualized. Right epididymis:  A 3 mm cyst/spermatocele noted. Left epididymis:  A 4 mm cyst/ spermatocele noted. Hydrocele:  Small right hydrocele noted. Varicocele:  None visualized. Pulsed Doppler interrogation of both testes demonstrates normal low resistance arterial and venous waveforms bilaterally.  IMPRESSION: Normal testicles.  No evidence of testicular torsion. Small right hydrocele. Electronically Signed   By: Harmon Pier M.D.   On: 07/02/2015 14:54    Assessment and Plan: Paresthesias - Plan: DG Cervical Spine Complete, DG Thoracic Spine 2 View, DG Lumbar Spine 2-3 Views, Ambulatory referral to Neurology  Leukocytosis - Plan: CBC  Bilateral back pain, unspecified location - Plan: DG Cervical Spine Complete, DG Thoracic Spine 2 View, DG Lumbar Spine 2-3 Views, Ambulatory referral to Neurology  Here today with about a month of unusual sx. At this time he has a feeling that his sensation is just not as it  usually is but cannot give me detail. I do wonder if his sx are due to anxiety or stress, but this would be a dx of exclusion Planned to do plain films of his back today and to recheck his CBC due to recent leukocytosis. Assuming these tests are unrevealing will refer him to neurology. He did have a CT of his head that was negative but he has not had an MRI and a TIA/ CVA or other disorder such as MS is possible   Signed Abbe Amsterdam, MD

## 2015-07-21 NOTE — Telephone Encounter (Signed)
Reviewed. He is scheduled today to see Dr. Dallas Schimke. Will keep an eye out for her notes.

## 2015-07-22 ENCOUNTER — Telehealth: Payer: Self-pay | Admitting: *Deleted

## 2015-07-22 LAB — CBC
HCT: 49.7 % (ref 39.0–52.0)
Hemoglobin: 16.9 g/dL (ref 13.0–17.0)
MCHC: 33.9 g/dL (ref 30.0–36.0)
MCV: 88.7 fl (ref 78.0–100.0)
PLATELETS: 234 10*3/uL (ref 150.0–400.0)
RBC: 5.61 Mil/uL (ref 4.22–5.81)
RDW: 15 % (ref 11.5–15.5)
WBC: 15.1 10*3/uL — ABNORMAL HIGH (ref 4.0–10.5)

## 2015-07-22 NOTE — Addendum Note (Signed)
Addended by: Eustace QuailEABOLD, Lihanna Biever J on: 07/22/2015 05:19 PM   Modules accepted: Orders

## 2015-07-22 NOTE — Addendum Note (Signed)
Addended by: Eustace QuailEABOLD, Nelissa Bolduc J on: 07/22/2015 05:15 PM   Modules accepted: Orders

## 2015-07-22 NOTE — Telephone Encounter (Signed)
Spoke to [facility], unable to add sed rate, crp and smear . Spoke to patient . Patient agrees to come Monday to have labs drawn.

## 2015-07-25 ENCOUNTER — Other Ambulatory Visit (INDEPENDENT_AMBULATORY_CARE_PROVIDER_SITE_OTHER): Payer: BLUE CROSS/BLUE SHIELD

## 2015-07-25 DIAGNOSIS — D72829 Elevated white blood cell count, unspecified: Secondary | ICD-10-CM

## 2015-07-25 DIAGNOSIS — R202 Paresthesia of skin: Secondary | ICD-10-CM

## 2015-07-25 DIAGNOSIS — M549 Dorsalgia, unspecified: Secondary | ICD-10-CM

## 2015-07-26 ENCOUNTER — Encounter: Payer: Self-pay | Admitting: Family Medicine

## 2015-07-26 LAB — SEDIMENTATION RATE: Sed Rate: 1 mm/hr (ref 0–15)

## 2015-07-26 LAB — C-REACTIVE PROTEIN: CRP: <0.5 mg/dL (ref <0.60)

## 2015-07-26 LAB — PATHOLOGIST SMEAR REVIEW

## 2015-07-27 ENCOUNTER — Telehealth (HOSPITAL_COMMUNITY): Payer: Self-pay

## 2015-07-27 NOTE — Telephone Encounter (Signed)
Encounter complete. 

## 2015-07-29 ENCOUNTER — Ambulatory Visit (HOSPITAL_COMMUNITY)
Admission: RE | Admit: 2015-07-29 | Discharge: 2015-07-29 | Disposition: A | Discharge status: Home / Self Care | Payer: BLUE CROSS/BLUE SHIELD | Source: Ambulatory Visit | Attending: Physician Assistant | Admitting: Physician Assistant

## 2015-07-29 ENCOUNTER — Encounter: Payer: Self-pay | Admitting: Family Medicine

## 2015-07-29 ENCOUNTER — Encounter (HOSPITAL_COMMUNITY): Payer: Self-pay | Admitting: *Deleted

## 2015-07-29 DIAGNOSIS — R0989 Other specified symptoms and signs involving the circulatory and respiratory systems: Secondary | ICD-10-CM | POA: Insufficient documentation

## 2015-07-29 DIAGNOSIS — E785 Hyperlipidemia, unspecified: Secondary | ICD-10-CM | POA: Diagnosis not present

## 2015-07-29 DIAGNOSIS — D72829 Elevated white blood cell count, unspecified: Secondary | ICD-10-CM

## 2015-07-29 DIAGNOSIS — R0789 Other chest pain: Secondary | ICD-10-CM

## 2015-07-29 DIAGNOSIS — R5381 Other malaise: Secondary | ICD-10-CM

## 2015-07-29 DIAGNOSIS — I2510 Atherosclerotic heart disease of native coronary artery without angina pectoris: Secondary | ICD-10-CM

## 2015-07-29 NOTE — Progress Notes (Unsigned)
Patient ID: [name], male   DOB: 1985-08-25, 30 y.o.   MRN: [id number] PATIENT DECIDED HE WOULD RATHER RESCHEDULE DUE TO NEW CERVICAL ISSUES UNTIL HE SEE NEUROLOGIST NEXT WEEK..  HE WAS ESCORTED TO A SCHEDULER TO BE SCHEDULED AFTER HIS TEST NEXT WEEK.

## 2015-07-31 ENCOUNTER — Encounter: Payer: Self-pay | Admitting: Physician Assistant

## 2015-08-01 ENCOUNTER — Telehealth: Payer: Self-pay | Admitting: *Deleted

## 2015-08-01 NOTE — Telephone Encounter (Signed)
Lmptcb for carotid results 

## 2015-08-01 NOTE — Addendum Note (Signed)
Addended by: Abbe AmsterdamOPLAND, Jessieca Rhem C on: 08/01/2015 02:25 PM   Modules accepted: Orders

## 2015-08-01 NOTE — Telephone Encounter (Signed)
Pt has been notified of carotid us results by phone with verbal understanding.

## 2015-08-02 ENCOUNTER — Other Ambulatory Visit (INDEPENDENT_AMBULATORY_CARE_PROVIDER_SITE_OTHER): Payer: BLUE CROSS/BLUE SHIELD

## 2015-08-02 ENCOUNTER — Ambulatory Visit (HOSPITAL_COMMUNITY)
Admission: RE | Admit: 2015-08-02 | Discharge: 2015-08-02 | Disposition: A | Discharge status: Home / Self Care | Payer: BLUE CROSS/BLUE SHIELD | Source: Ambulatory Visit | Attending: Urology | Admitting: Urology

## 2015-08-02 DIAGNOSIS — R5381 Other malaise: Secondary | ICD-10-CM

## 2015-08-02 DIAGNOSIS — X58XXXA Exposure to other specified factors, initial encounter: Secondary | ICD-10-CM | POA: Insufficient documentation

## 2015-08-02 DIAGNOSIS — S3730XA Unspecified injury of urethra, initial encounter: Secondary | ICD-10-CM | POA: Insufficient documentation

## 2015-08-02 DIAGNOSIS — D72829 Elevated white blood cell count, unspecified: Secondary | ICD-10-CM

## 2015-08-02 LAB — CBC WITH DIFFERENTIAL/PLATELET
Basophils Absolute: 0 10*3/uL (ref 0.0–0.1)
Basophils Relative: 0.4 % (ref 0.0–3.0)
EOS ABS: 0.3 10*3/uL (ref 0.0–0.7)
Eosinophils Relative: 3.1 % (ref 0.0–5.0)
HCT: 46.7 % (ref 39.0–52.0)
HEMOGLOBIN: 15.9 g/dL (ref 13.0–17.0)
LYMPHS ABS: 3.8 10*3/uL (ref 0.7–4.0)
Lymphocytes Relative: 43.5 % (ref 12.0–46.0)
MCHC: 34.1 g/dL (ref 30.0–36.0)
MCV: 87.9 fl (ref 78.0–100.0)
MONO ABS: 0.6 10*3/uL (ref 0.1–1.0)
Monocytes Relative: 6.6 % (ref 3.0–12.0)
NEUTROS PCT: 46.4 % (ref 43.0–77.0)
Neutro Abs: 4.1 10*3/uL (ref 1.4–7.7)
Platelets: 235 10*3/uL (ref 150.0–400.0)
RBC: 5.32 Mil/uL (ref 4.22–5.81)
RDW: 14.1 % (ref 11.5–15.5)
WBC: 8.9 10*3/uL (ref 4.0–10.5)

## 2015-08-02 LAB — TSH: TSH: 1.69 u[IU]/mL (ref 0.35–4.50)

## 2015-08-02 MED ORDER — GADOBENATE DIMEGLUMINE 529 MG/ML IV SOLN
15.0000 mL | Freq: Once | INTRAVENOUS | Status: AC | PRN
Start: 2015-08-02 — End: 2015-08-02
  Administered 2015-08-02: 14 mL via INTRAVENOUS

## 2015-08-03 LAB — TESTOSTERONE TOTAL,FREE,BIO, MALES
Albumin: 4.8 g/dL (ref 3.6–5.1)
Sex Hormone Binding: 45 nmol/L (ref 10–50)
TESTOSTERONE BIOAVAILABLE: 117 ng/dL — AB (ref 130.5–681.7)
TESTOSTERONE: 523 ng/dL (ref 250–827)
Testosterone, Free: 53.5 pg/mL (ref 47.0–244.0)

## 2015-08-03 LAB — EPSTEIN-BARR VIRUS VCA ANTIBODY PANEL
EBV EA IgG: <5 U/mL (ref <9.0)
EBV NA IgG: 464 U/mL — ABNORMAL HIGH (ref <18.0)
EBV VCA IGG: 557 U/mL — AB (ref <18.0)

## 2015-08-08 ENCOUNTER — Ambulatory Visit (HOSPITAL_COMMUNITY): Payer: BLUE CROSS/BLUE SHIELD

## 2015-08-12 ENCOUNTER — Other Ambulatory Visit (INDEPENDENT_AMBULATORY_CARE_PROVIDER_SITE_OTHER): Payer: BLUE CROSS/BLUE SHIELD

## 2015-08-12 ENCOUNTER — Encounter: Payer: Self-pay | Admitting: Neurology

## 2015-08-12 ENCOUNTER — Encounter (HOSPITAL_COMMUNITY): Payer: BLUE CROSS/BLUE SHIELD

## 2015-08-12 ENCOUNTER — Ambulatory Visit (INDEPENDENT_AMBULATORY_CARE_PROVIDER_SITE_OTHER): Payer: BLUE CROSS/BLUE SHIELD | Admitting: Neurology

## 2015-08-12 VITALS — BP 110/70 | HR 70 | Ht 67.0 in | Wt 155.0 lb

## 2015-08-12 DIAGNOSIS — R202 Paresthesia of skin: Secondary | ICD-10-CM

## 2015-08-12 DIAGNOSIS — R258 Other abnormal involuntary movements: Secondary | ICD-10-CM

## 2015-08-12 DIAGNOSIS — R2990 Unspecified symptoms and signs involving the nervous system: Secondary | ICD-10-CM

## 2015-08-12 DIAGNOSIS — R29818 Other symptoms and signs involving the nervous system: Secondary | ICD-10-CM

## 2015-08-12 DIAGNOSIS — H9313 Tinnitus, bilateral: Secondary | ICD-10-CM | POA: Diagnosis not present

## 2015-08-12 DIAGNOSIS — M542 Cervicalgia: Secondary | ICD-10-CM

## 2015-08-12 DIAGNOSIS — R253 Fasciculation: Secondary | ICD-10-CM

## 2015-08-12 LAB — VITAMIN B12: VITAMIN B 12: 415 pg/mL (ref 211–911)

## 2015-08-12 NOTE — Patient Instructions (Addendum)
1.  MRI brain wwo contrast 2.  MRI cervical without contrast 3.  Carotid ultrasound 4.  Check blood work  Telephone call with results.  If the above testing returns normal, plan

## 2015-08-12 NOTE — Progress Notes (Signed)
[facility] Clinic Note - Initial Visit   Date: 08/12/2015  [name] MRN: [id number] DOB: Mar 06, 1986   Dear Dr. Lorelei Pont:  Thank you for your kind referral of [name] for consultation of generalized paresthesias. Although his history is well known to you, please allow [place] to reiterate it for the purpose of our medical record. The patient was accompanied to the clinic by self.    History of Present Illness: [name] is a 30 y.o. right-handed Caucasian male with hyperlipidemia, ischemic cardiomyopathy due to MI s/p PCI and DES to LAD (2015), CAD,  presenting for evaluation of numerous symptoms including paresthesias, weakness, muscle twitches, as well as other complaints.    In early February, he was working out and developed right arm weakness.  He thought it was related to his cardiac history, so took aspirin and nitroglycerin and called EMS. His arm weakness quickly resolved, but he does not recall all the events. Since then, he reports having new symptoms every few days.  He developed bilateral hand shakiness and numbness over the neck, back, arms, and legs.  Symptoms are constant.  There is no alleviating or exacerbating factors.  He has a number of other complaints including tinnitus, headaches, muscle spasm of the body, constipation, diarrhea, and whole-body tingling.  Some of these symptoms come and go, others are persistent.  He also complains of burning tingling over the base of his neck.    He had seen a chiropractor the same day he was evaluated at the hospital who performed adjustments of his cervical, thoracic, and lumbar region.     He has had a number of testing including CT head, US carotids, plain XR of the cervical, thoracic, and lumbar spine, and serology testing for various infectious and inflammatory causes which has returned normal.  He denies any preceding injury, illness, or life stressors.  He used to have history of  migraines, but this has been well-controlled with the last migraine several years ago.   Out-side paper records, electronic medical record, and images have been reviewed where available and summarized as:  CT head 07/15/2015:  Normal Labs 07/05/2015:  HIV neg, RPR neg, ESR 4 US carotids 07/29/2015:  Normal   Lab Results  Component Value Date   TSH 1.69 08/02/2015   Lab Results  Component Value Date   CHOL 141 12/14/2014   HDL 54.80 12/14/2014   LDLCALC 55 12/14/2014   TRIG 158.0* 12/14/2014   CHOLHDL 3 12/14/2014     Past Medical History  Diagnosis Date  . Hyperlipidemia   . Ischemic cardiomyopathy     Ejection fraction of 40-45% due to anterior myocardial infarction  . Coronary artery disease   . History of Doppler ultrasound     a. Carotid US 3/17: normal carotid arteries bilaterally  . Hidradenitis     Past Surgical History  Procedure Laterality Date  . Cardiac catheterization    . Coronary angioplasty    . Left heart catheterization with coronary angiogram N/A 06/14/2013    Procedure: LEFT HEART CATHETERIZATION WITH CORONARY ANGIOGRAM;  Surgeon: Wellington Hampshire, MD;  Location: [facility] CATH LAB;  Service: Cardiovascular;  Laterality: N/A;  . Percutaneous coronary stent intervention (pci-s)  06/14/2013    Procedure: PERCUTANEOUS CORONARY STENT INTERVENTION (PCI-S);  Surgeon: Wellington Hampshire, MD;  Location: [facility] CATH LAB;  Service: Cardiovascular;;  mid LAD 2DES placed  . Ankle surgery Left   . Pilonidal cyst excision    . Wisdom  tooth extraction       Medications:  Outpatient Encounter Prescriptions as of 08/12/2015  Medication Sig Note  . aspirin EC 81 MG EC tablet Take 1 tablet (81 mg total) by mouth daily.   Marland Kitchen atorvastatin (LIPITOR) 10 MG tablet TAKE 1 TABLET BY MOUTH DAILY   . lisinopril (PRINIVIL,ZESTRIL) 5 MG tablet TAKE 1 TABLET BY MOUTH EVERY DAY   . metoprolol tartrate (LOPRESSOR) 25 MG tablet TAKE 1 TABLET BY MOUTH TWICE DAILY   . Multiple Vitamin  (MULTI-VITAMINS) TABS Take 1 tablet by mouth daily. 06/28/2015: Received from: [place]: Take by mouth.  . nitroGLYCERIN (NITROSTAT) 0.4 MG SL tablet Place 1 tablet (0.4 mg total) under the tongue every 5 (five) minutes as needed for chest pain. (Patient not taking: Reported on 08/12/2015)    No facility-administered encounter medications on file as of 08/12/2015.     Allergies:  Allergies  Allergen Reactions  . Penicillins Other (See Comments)    Unknown Childhood reaction    Family History: Family History  Problem Relation Age of Onset  . Diabetes Paternal Grandmother   . Coronary artery disease Paternal Grandmother   . Healthy Mother   . Headache Father   . Healthy Brother     Social History: Social History  Substance Use Topics  . Smoking status: Never Smoker   . Smokeless tobacco: Never Used  . Alcohol Use: 0.0 oz/week    0 Standard drinks or equivalent per week     Comment: once a week - not currently    Social History   Social History Narrative   He lives alone.  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   Education:  B.S.     Review of Systems:  CONSTITUTIONAL: No fevers, chills, night sweats, or weight loss.   EYES: No visual changes or eye pain ENT: No hearing changes.  No history of nose bleeds.   RESPIRATORY: No cough, wheezing and shortness of breath.   CARDIOVASCULAR: Negative for chest pain, and palpitations.   GI: Negative for abdominal discomfort, blood in stools or black stools.  No recent change in bowel habits.   GU:  No history of incontinence.   MUSCLOSKELETAL: No history of joint pain or swelling.  No myalgias.   SKIN: Negative for lesions, rash, and itching.   HEMATOLOGY/ONCOLOGY: Negative for prolonged bleeding, bruising easily, and swollen nodes.  No history of cancer.   ENDOCRINE: Negative for cold or heat intolerance, polydipsia or goiter.   PSYCH:  No depression or anxiety symptoms.   NEURO: As Above.   Vital  Signs:  BP 110/70 mmHg  Pulse 70  Ht _0  (1.702 m)  Wt 155 lb (70.308 kg)  BMI 24.27 kg/m2 Pain Scale: 0 on a scale of 0-10   General Medical Exam:   General:  Well appearing, comfortable.   Eyes/ENT: see cranial nerve examination.   Neck: No masses appreciated.  Full range of motion without tenderness.  No carotid bruits. Respiratory:  Clear to auscultation, good air entry bilaterally.   Cardiac:  Regular rate and rhythm, no murmur.   Extremities:  No deformities, edema, or skin discoloration.  Skin:  No rashes or lesions.  Neurological Exam: MENTAL STATUS including orientation to time, place, person, recent and remote memory, attention span and concentration, language, and fund of knowledge is normal.  Speech is not dysarthric.  CRANIAL NERVES: II:  No visual field defects.  Unremarkable fundi.   III-IV-VI: Pupils equal  round and reactive to light.  Normal conjugate, extra-ocular eye movements in all directions of gaze.  No nystagmus.  No ptosis.   V:  Normal facial sensation.     VII:  Normal facial symmetry and movements.  VIII:  Normal hearing and vestibular function.   IX-X:  Normal palatal movement.   XI:  Normal shoulder shrug and head rotation.   XII:  Normal tongue strength and range of motion, no deviation or fasciculation.  MOTOR:  No atrophy, fasciculations or abnormal movements.  No pronator drift.  Tone is normal.    Right Upper Extremity:    Left Upper Extremity:    Deltoid  5/5   Deltoid  5/5   Biceps  5/5   Biceps  5/5   Triceps  5/5   Triceps  5/5   Wrist extensors  5/5   Wrist extensors  5/5   Wrist flexors  5/5   Wrist flexors  5/5   Finger extensors  5/5   Finger extensors  5/5   Finger flexors  5/5   Finger flexors  5/5   Dorsal interossei  5/5   Dorsal interossei  5/5   Abductor pollicis  5/5   Abductor pollicis  5/5   Tone (Ashworth scale)  0  Tone (Ashworth scale)  0   Right Lower Extremity:    Left Lower Extremity:    Hip flexors  5/5   Hip  flexors  5/5   Hip extensors  5/5   Hip extensors  5/5   Knee flexors  5/5   Knee flexors  5/5   Knee extensors  5/5   Knee extensors  5/5   Dorsiflexors  5/5   Dorsiflexors  5/5   Plantarflexors  5/5   Plantarflexors  5/5   Toe extensors  5/5   Toe extensors  5/5   Toe flexors  5/5   Toe flexors  5/5   Tone (Ashworth scale)  0  Tone (Ashworth scale)  0   MSRs:  Right                                                                 Left brachioradialis 2+  brachioradialis 2+  biceps 2+  biceps 2+  triceps 2+  triceps 2+  patellar 2+  patellar 2+  ankle jerk 2+  ankle jerk 2+  Hoffman no  Hoffman no  plantar response down  plantar response down   SENSORY:  Normal and symmetric perception of light touch, pinprick, vibration, and proprioception.  Romberg's sign absent.   COORDINATION/GAIT: Normal finger-to- nose-finger and heel-to-shin.  Intact rapid alternating movements bilaterally.  Able to rise from a chair without using arms.  Gait narrow based and stable. Tandem and stressed gait intact.    IMPRESSION: Mr. Bilyk is a 30 year-old gentleman presenting for a constellation of neurological and non-neurological complaints.  His exam is entirely normal and non-focal.  With the progressive and nonanatomical nature of symptoms, I will order MRI brain to be exclude demyelinating disease, although my overall suspicion is slow.  Patient is very concerned about the burning paresthesias of his neck and is requesting imaging of his cervical spine.  I explained that with a normal neurological exam, the likelihood of something worrisome in  the cervical spine is low, but I do not feel that he will be reassured with imaging of the brain alone.  There is no evidence of carotid disease to suggest symptoms could have been related to cervical manipulation.    PLAN/RECOMMENDATIONS:  1.  MRI brain wwo contrast 2.  MRI cervical without contrast 3.  Check vitamin B12  Further recommendations based on the  results of the above.  If paresthesias worsen, consider NCS/EMG.    The duration of this appointment visit was 40 minutes of face-to-face time with the patient.  Greater than 50% of this time was spent in counseling, explanation of diagnosis, planning of further management, and coordination of care.   Thank you for allowing me to participate in patient's care.  If I can answer any additional questions, I would be pleased to do so.    Sincerely,    Donika K. Posey Pronto, DO

## 2015-08-15 ENCOUNTER — Ambulatory Visit (INDEPENDENT_AMBULATORY_CARE_PROVIDER_SITE_OTHER): Payer: BLUE CROSS/BLUE SHIELD | Admitting: Family Medicine

## 2015-08-15 ENCOUNTER — Encounter: Payer: Self-pay | Admitting: Family Medicine

## 2015-08-15 ENCOUNTER — Encounter: Payer: Self-pay | Admitting: *Deleted

## 2015-08-15 VITALS — BP 110/72 | HR 79 | Temp 98.6°F | Ht 67.0 in | Wt 155.0 lb

## 2015-08-15 DIAGNOSIS — Z711 Person with feared health complaint in whom no diagnosis is made: Secondary | ICD-10-CM | POA: Diagnosis not present

## 2015-08-15 NOTE — Progress Notes (Signed)
[facility] [address], [address] [place], [place] [address] [phone] [phone]  Date:  08/15/2015   Name:  [name]   DOB:  [birth year]   MRN:  [id number]  PCP:  Abbe AmsterdamOPLAND,JESSICA, MD    Chief Complaint: Follow-up   History of Present Illness:  [name] Linatrick M Bracey is a 30 y.o. very pleasant male patient who presents with the following:  I saw this pt about 3 weeks ago as a new patient when he had concern of various sensory changes and symptoms that did not follow any obvious diagnosis pattern. He saw neurology last week- exam normal. They plan for an MRI of his brain and neck and to check vitamin B12.    He also does have a history of NSTEMI in 2015- recent carotid looked ok,  They had planned for a treadmill but this may end up not being necessary.  He did not end up having the ETT at least at this time  Here today with concerns about his toenails looking "much different than they typically did," he thinks they look more white when he looks at them. Also, he has noted what might be a splinter hemorrhage under one thumb nail.   "I just want to make sure everything is ok."  His MRI is next week.    Leukocytosis has resolved on most recent labs.   He does not have any other particular concerns today  Patient Active Problem List   Diagnosis Date Noted  . 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 Leukocytosis 07/05/2015  . Bicuspid aortic valve 07/28/2013  . Palpitations 07/03/2013  . Hyperlipidemia   . Ischemic cardiomyopathy   . Coronary artery disease   . History of hidradenitis suppurativa 06/15/2013  . Amphetamine adverse reaction 06/15/2013  . Chest pain 06/14/2013  . NSTEMI (non-ST elevated myocardial infarction) (HCC) 06/14/2013    Past Medical History  Diagnosis Date  . Hyperlipidemia   . Ischemic cardiomyopathy     Ejection fraction of 40-45% due to anterior myocardial infarction  . Coronary artery disease   .  History of Doppler ultrasound     a. Carotid US 3/17: normal carotid arteries bilaterally  . Hidradenitis     Past Surgical History  Procedure Laterality Date  . Cardiac catheterization    . Coronary angioplasty    . Left heart catheterization with coronary angiogram N/A 06/14/2013    Procedure: LEFT HEART CATHETERIZATION WITH CORONARY ANGIOGRAM;  Surgeon: Iran OuchMuhammad A Arida, MD;  Location: [facility] CATH LAB;  Service: Cardiovascular;  Laterality: N/A;  . Percutaneous coronary stent intervention (pci-s)  06/14/2013    Procedure: PERCUTANEOUS CORONARY STENT INTERVENTION (PCI-S);  Surgeon: Iran OuchMuhammad A Arida, MD;  [facility];  Service: Cardiovascular;;  mid LAD 2DES placed  . Ankle surgery Left   . Pilonidal cyst excision    . Wisdom tooth extraction      Social History  Substance Use Topics  . Smoking status: Never Smoker   . Smokeless tobacco: Never Used  . Alcohol Use: 0.0 oz/week    0 Standard drinks or equivalent per week     Comment: once a week - not currently     Family History  Problem Relation Age of Onset  . Diabetes Paternal Grandmother   . Coronary artery disease Paternal Grandmother   . Healthy Mother   . Headache Father   . Healthy Brother     Allergies  Allergen Reactions  .  Penicillins Other (See Comments)    Unknown Childhood reaction    Medication list has been reviewed and updated.  Current Outpatient Prescriptions on File Prior to Visit  Medication Sig Dispense Refill  . aspirin EC 81 MG EC tablet Take 1 tablet (81 mg total) by mouth daily.    Marland Kitchen atorvastatin (LIPITOR) 10 MG tablet TAKE 1 TABLET BY MOUTH DAILY 30 tablet 6  . lisinopril (PRINIVIL,ZESTRIL) 5 MG tablet TAKE 1 TABLET BY MOUTH EVERY DAY 90 tablet 3  . metoprolol tartrate (LOPRESSOR) 25 MG tablet TAKE 1 TABLET BY MOUTH TWICE DAILY 60 tablet 6  . Multiple Vitamin (MULTI-VITAMINS) TABS Take 1 tablet by mouth daily.    . nitroGLYCERIN (NITROSTAT) 0.4 MG SL tablet Place 1 tablet (0.4 mg  total) under the tongue every 5 (five) minutes as needed for chest pain. 25 tablet 12   No current facility-administered medications on file prior to visit.    Review of Systems:  As per HPI- otherwise negative.   Physical Examination: Filed Vitals:   08/15/15 1122  BP: 110/72  Pulse: 79  Temp: 98.6 F (37 C)   Filed Vitals:   08/15/15 1122  Height:  (1.702 m)  Weight: 155 lb (70.308 kg)   Body mass index is 24.27 kg/(m^2). Ideal Body Weight: Weight in (lb) to have BMI = 25: 159.3  GEN: WDWN, NAD, Non-toxic, A & O x 3, looks well, fit build HEENT: Atraumatic, Normocephalic. Neck supple. No masses, No LAD. Ears and Nose: No external deformity. CV: RRR, No M/G/R. No JVD. No thrill. No extra heart sounds. PULM: CTA B, no wheezes, crackles, rhonchi. No retractions. No resp. distress. No accessory muscle use. EXTR: No c/c/e NEURO Normal gait.  PSYCH: Normally interactive. Conversant. Not depressed or anxious appearing.  Calm demeanor.  Toenails appear normal, normal cap refill and DP pulses to bilateral feet.   He does have one mark on a thumb that could be a called a splinter hemorrhage but which may also be due to trauma  Assessment and Plan: Worried well  At this time offered reassurance that I do not think his nail findings indicate anything serious.  He will let me know what his MRI shows.   Discussed anxiety about his health problems- he did have an MI at a young age which certainly could increase his anxiety about his health. At this time he does not feel that anxiety is an issue but we will keep this in mind   Signed Abbe Amsterdam, MD

## 2015-08-15 NOTE — Patient Instructions (Signed)
Your nails look fine to me today. I will look for the report from your MRI Please let me know if any cause is found for your symptoms.

## 2015-08-22 ENCOUNTER — Ambulatory Visit
Admission: RE | Admit: 2015-08-22 | Discharge: 2015-08-22 | Disposition: A | Discharge status: Home / Self Care | Payer: BLUE CROSS/BLUE SHIELD | Source: Ambulatory Visit | Attending: Neurology | Admitting: Neurology

## 2015-08-22 DIAGNOSIS — R2990 Unspecified symptoms and signs involving the nervous system: Secondary | ICD-10-CM

## 2015-08-22 DIAGNOSIS — R202 Paresthesia of skin: Secondary | ICD-10-CM

## 2015-08-22 DIAGNOSIS — R253 Fasciculation: Secondary | ICD-10-CM

## 2015-08-22 DIAGNOSIS — H9313 Tinnitus, bilateral: Secondary | ICD-10-CM

## 2015-08-22 DIAGNOSIS — M50222 Other cervical disc displacement at C5-C6 level: Secondary | ICD-10-CM | POA: Diagnosis not present

## 2015-08-22 DIAGNOSIS — M50223 Other cervical disc displacement at C6-C7 level: Secondary | ICD-10-CM | POA: Diagnosis not present

## 2015-08-22 MED ORDER — GADOBENATE DIMEGLUMINE 529 MG/ML IV SOLN
14.0000 mL | Freq: Once | INTRAVENOUS | Status: AC | PRN
  Administered 2015-08-22: 14 mL via INTRAVENOUS

## 2015-10-09 ENCOUNTER — Other Ambulatory Visit: Payer: Self-pay | Admitting: Cardiovascular Disease

## 2015-10-10 NOTE — Telephone Encounter (Signed)
Please Review For RX Refill

## 2016-02-29 IMAGING — US US SCROTUM
1 series · 14 of 25 positions shown · non-contrast
Comparison: None.

CLINICAL DATA: 29-year-old male with acute right testicular pain
today.

EXAM:
SCROTAL ULTRASOUND
DOPPLER ULTRASOUND OF THE TESTICLES
TECHNIQUE: Complete ultrasound examination of the testicles, epididymis, and
other scrotal structures was performed. Color and spectral Doppler
ultrasound were also utilized to evaluate blood flow to the
testicles.

[Series 1: us scrotum · 0.07mm/px · 14 of 81 slices shown]
[im 1/81]
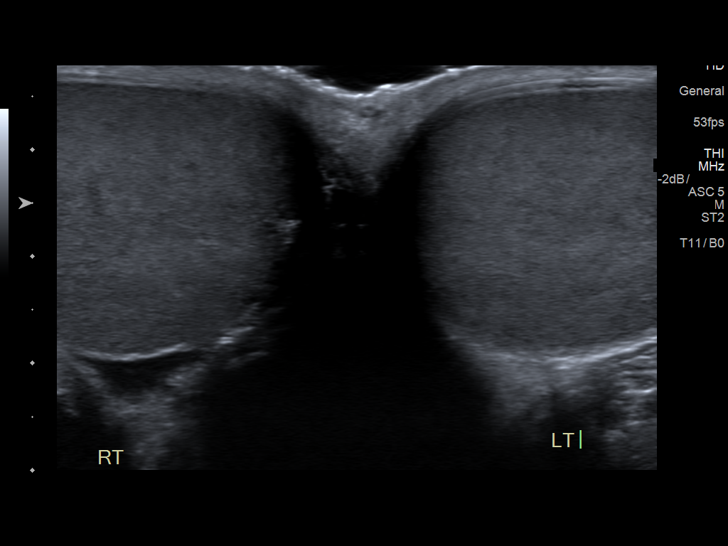
[im 7/81]
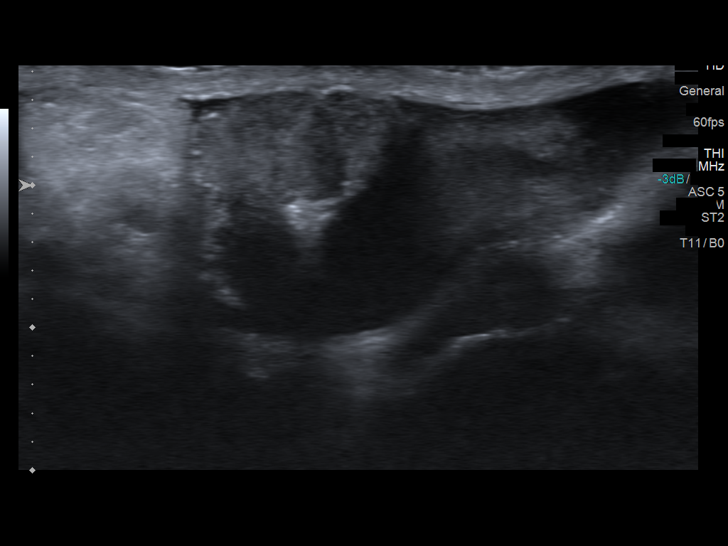
[im 14/81]
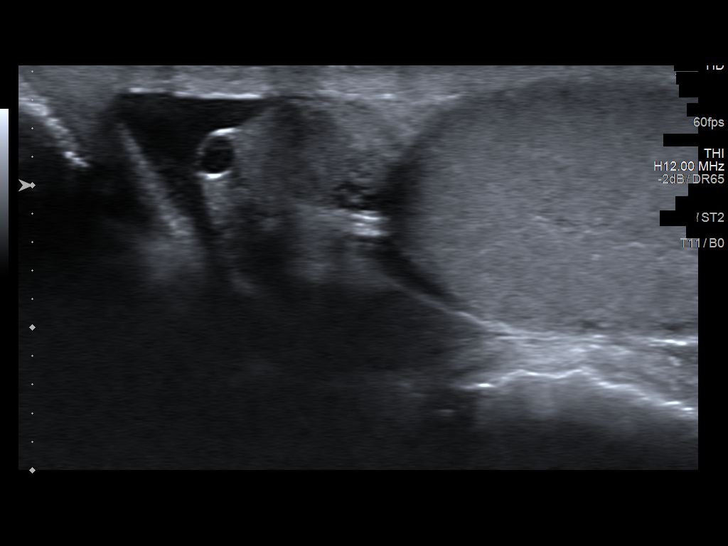
[im 21/81]
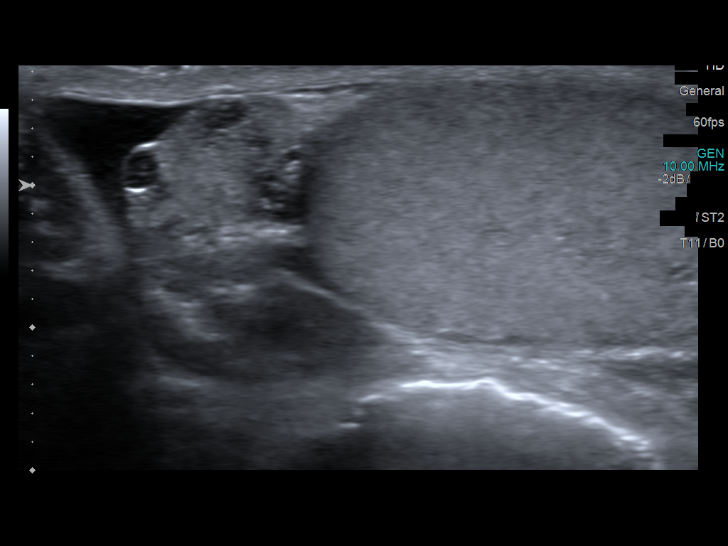
[im 27/81]
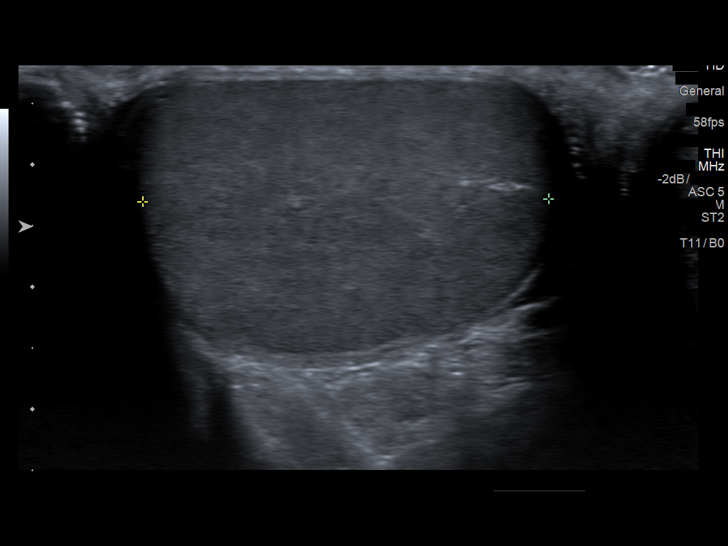
[im 31/81]
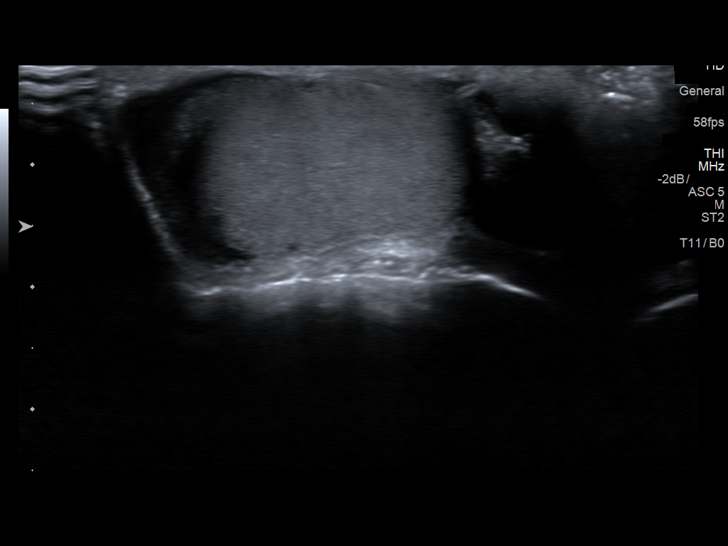
[im 37/81]
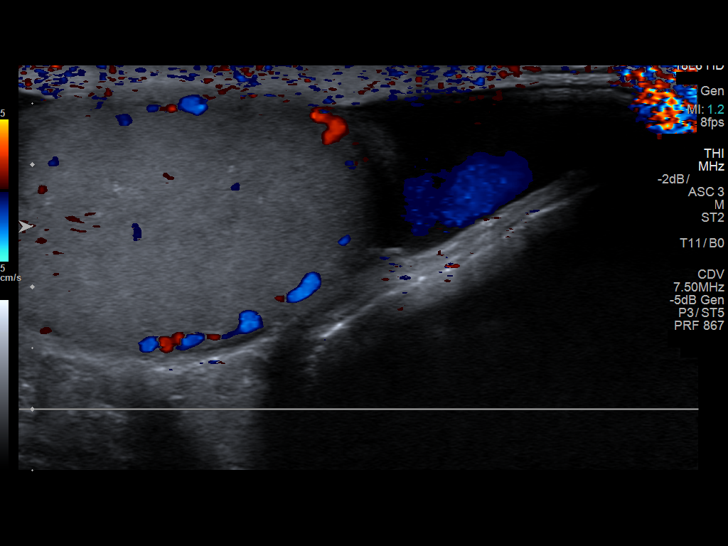
[im 44/81]
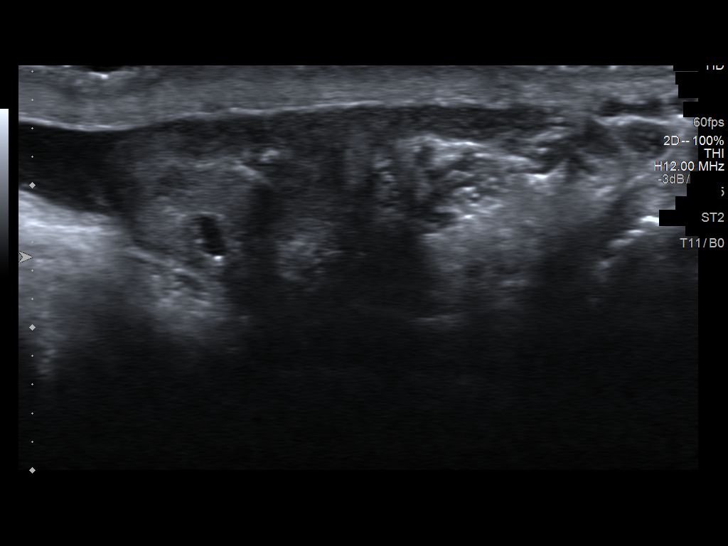
[im 51/81]
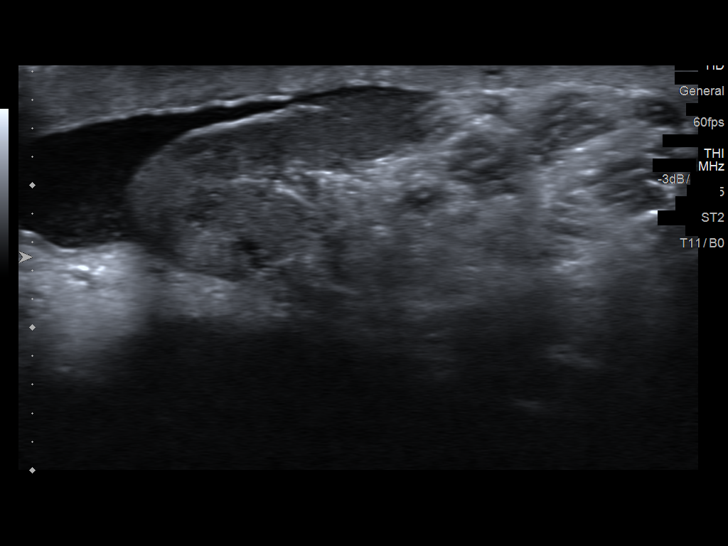
[im 54/81]
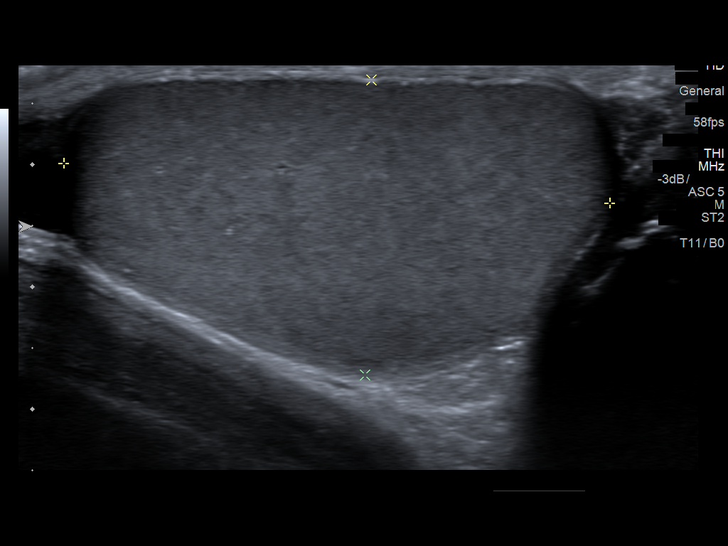
[im 61/81]
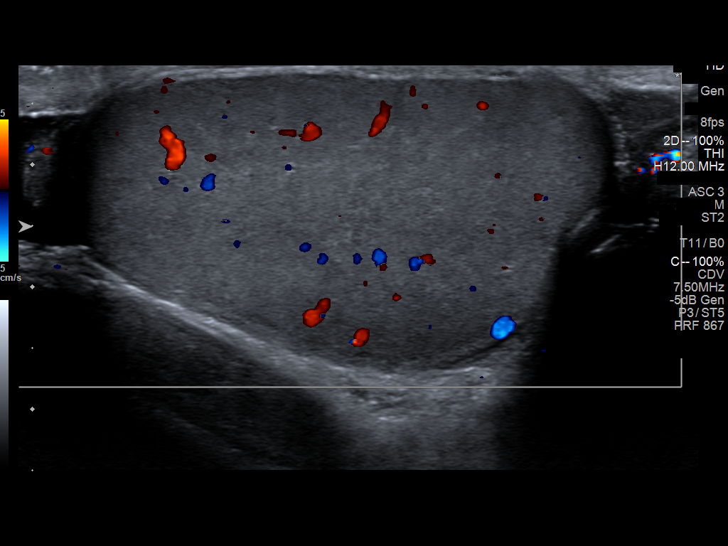
[im 67/81]
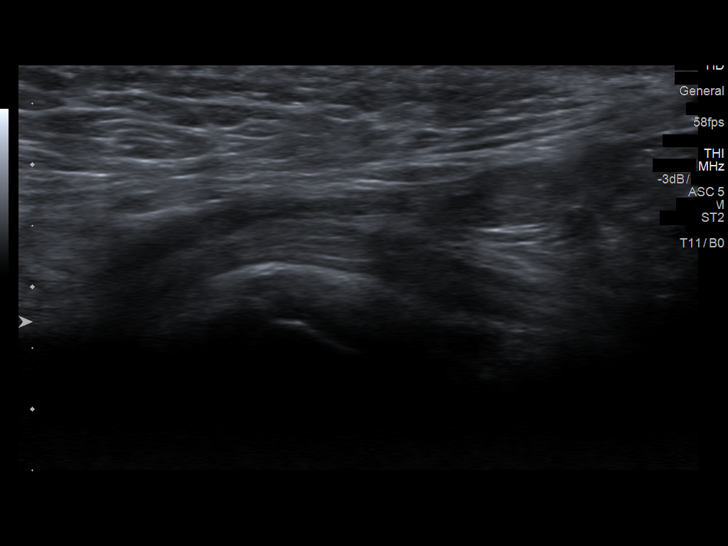
[im 74/81]
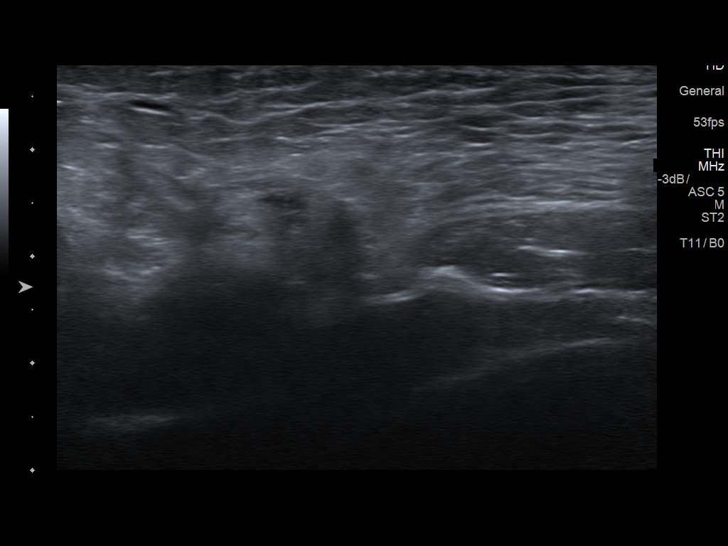
[im 81/81]
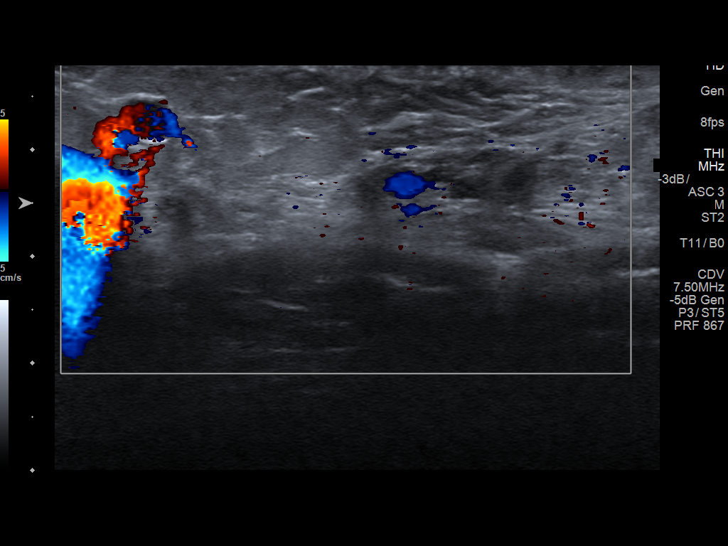

[14 of 25 positions shown; findings below may reference images not displayed]

FINDINGS: Right testicle

Measurements: 4.7 x 2.2 x 3.3 cm. No mass or microlithiasis
visualized.

Left testicle

Measurements: 4.5 x 2.4 x 3.3 cm. No mass or microlithiasis
visualized.

Right epididymis:  A 3 mm cyst/spermatocele noted.

Left epididymis:  A 4 mm cyst/ spermatocele noted.

Hydrocele:  Small right hydrocele noted.

Varicocele:  None visualized.

Pulsed Doppler interrogation of both testes demonstrates normal low
resistance arterial and venous waveforms bilaterally.
IMPRESSION: Normal testicles.  No evidence of testicular torsion.

Small right hydrocele.

## 2016-03-21 ENCOUNTER — Other Ambulatory Visit: Payer: Self-pay | Admitting: *Deleted

## 2016-03-21 MED ORDER — LISINOPRIL 5 MG PO TABS: 5.0000 mg | ORAL_TABLET | Freq: Every day | ORAL | 0 refills | Status: DC

## 2016-03-21 MED ORDER — ATORVASTATIN CALCIUM 10 MG PO TABS: 10.0000 mg | ORAL_TABLET | Freq: Every day | ORAL | 0 refills | Status: DC

## 2016-03-21 MED ORDER — METOPROLOL TARTRATE 25 MG PO TABS: 25.0000 mg | ORAL_TABLET | Freq: Two times a day (BID) | ORAL | 0 refills | Status: DC

## 2016-06-11 ENCOUNTER — Other Ambulatory Visit: Payer: Self-pay | Admitting: *Deleted

## 2016-06-11 MED ORDER — METOPROLOL TARTRATE 25 MG PO TABS: 25.0000 mg | ORAL_TABLET | Freq: Two times a day (BID) | ORAL | 0 refills | Status: DC

## 2016-06-11 MED ORDER — LISINOPRIL 5 MG PO TABS: 5.0000 mg | ORAL_TABLET | Freq: Every day | ORAL | 0 refills | Status: DC

## 2016-06-11 MED ORDER — ATORVASTATIN CALCIUM 10 MG PO TABS: 10.0000 mg | ORAL_TABLET | Freq: Every day | ORAL | 0 refills | Status: DC

## 2016-09-07 ENCOUNTER — Other Ambulatory Visit: Payer: Self-pay | Admitting: Cardiovascular Disease

## 2016-09-07 MED ORDER — ATORVASTATIN CALCIUM 10 MG PO TABS: 10.0000 mg | ORAL_TABLET | Freq: Every day | ORAL | 0 refills | Status: DC

## 2016-09-07 MED ORDER — LISINOPRIL 5 MG PO TABS: 5.0000 mg | ORAL_TABLET | Freq: Every day | ORAL | 0 refills | Status: DC

## 2016-09-07 MED ORDER — METOPROLOL TARTRATE 25 MG PO TABS: 25.0000 mg | ORAL_TABLET | Freq: Two times a day (BID) | ORAL | 0 refills | Status: DC

## 2016-09-25 ENCOUNTER — Other Ambulatory Visit: Payer: Self-pay | Admitting: *Deleted

## 2016-09-25 MED ORDER — LISINOPRIL 5 MG PO TABS
5.0000 mg | ORAL_TABLET | Freq: Every day | ORAL | 0 refills | Status: DC
Start: 2016-09-25 — End: 2016-10-29

## 2016-09-25 MED ORDER — ATORVASTATIN CALCIUM 10 MG PO TABS: 10.0000 mg | ORAL_TABLET | Freq: Every day | ORAL | 0 refills | Status: DC

## 2016-09-25 MED ORDER — METOPROLOL TARTRATE 25 MG PO TABS: 25.0000 mg | ORAL_TABLET | Freq: Two times a day (BID) | ORAL | 0 refills | Status: DC

## 2016-10-29 ENCOUNTER — Telehealth: Payer: Self-pay | Admitting: Cardiovascular Disease

## 2016-10-29 MED ORDER — ATORVASTATIN CALCIUM 10 MG PO TABS: 10.0000 mg | ORAL_TABLET | Freq: Every day | ORAL | 0 refills | Status: DC

## 2016-10-29 MED ORDER — LISINOPRIL 5 MG PO TABS: 5.0000 mg | ORAL_TABLET | Freq: Every day | ORAL | 0 refills | Status: DC

## 2016-10-29 MED ORDER — METOPROLOL TARTRATE 25 MG PO TABS: 25.0000 mg | ORAL_TABLET | Freq: Two times a day (BID) | ORAL | 0 refills | Status: DC

## 2016-10-29 NOTE — Telephone Encounter (Signed)
Refill sent to the pharmacy electronically.  

## 2016-10-29 NOTE — Telephone Encounter (Signed)
New message    *STAT* If patient is at the pharmacy, call can be transferred to refill team.   1. Which medications need to be refilled? (please list name of each medication and dose if known) metoprolol tartrate (LOPRESSOR) 25 MG tablet lisinopril (PRINIVIL,ZESTRIL) 5 MG tablet atorvastatin (LIPITOR) 10 MG tablet  2. Which pharmacy/location (including street and city if local pharmacy) is medication to be sent to? [facility] [place]   3. Do they need a 30 day or 90 day supply? 90 day supply   Pt made appt for July 17th with Dr. Kirke CorinArida

## 2016-12-04 ENCOUNTER — Encounter: Payer: Self-pay | Admitting: Cardiovascular Disease

## 2016-12-04 ENCOUNTER — Ambulatory Visit (INDEPENDENT_AMBULATORY_CARE_PROVIDER_SITE_OTHER): Payer: BLUE CROSS/BLUE SHIELD | Admitting: Cardiovascular Disease

## 2016-12-04 VITALS — BP 96/62 | HR 59 | Ht 67.0 in | Wt 169.0 lb

## 2016-12-04 DIAGNOSIS — Z79899 Other long term (current) drug therapy: Secondary | ICD-10-CM

## 2016-12-04 DIAGNOSIS — Q231 Congenital insufficiency of aortic valve: Secondary | ICD-10-CM | POA: Diagnosis not present

## 2016-12-04 DIAGNOSIS — I255 Ischemic cardiomyopathy: Secondary | ICD-10-CM

## 2016-12-04 DIAGNOSIS — I2510 Atherosclerotic heart disease of native coronary artery without angina pectoris: Secondary | ICD-10-CM

## 2016-12-04 DIAGNOSIS — E785 Hyperlipidemia, unspecified: Secondary | ICD-10-CM | POA: Diagnosis not present

## 2016-12-04 LAB — HEPATIC FUNCTION PANEL
ALBUMIN: 4.9 g/dL (ref 3.5–5.5)
ALK PHOS: 62 IU/L (ref 39–117)
ALT: 59 IU/L — AB (ref 0–44)
AST: 31 IU/L (ref 0–40)
BILIRUBIN TOTAL: 0.8 mg/dL (ref 0.0–1.2)
BILIRUBIN, DIRECT: 0.25 mg/dL (ref 0.00–0.40)
Total Protein: 7.2 g/dL (ref 6.0–8.5)

## 2016-12-04 LAB — LIPID PANEL
CHOLESTEROL TOTAL: 135 mg/dL (ref 100–199)
Chol/HDL Ratio: 2.4 ratio (ref 0.0–5.0)
HDL: 56 mg/dL (ref >39)
LDL Calculated: 60 mg/dL (ref 0–99)
TRIGLYCERIDES: 94 mg/dL (ref 0–149)
VLDL Cholesterol Cal: 19 mg/dL (ref 5–40)

## 2016-12-04 NOTE — Patient Instructions (Signed)
Medication Instructions:  Continue current medications  Labwork: Fasting Lipids Liver  Testing/Procedures: None Ordered  Follow-Up: Your physician wants you to follow-up in: 1 Year. You will receive a reminder letter in the mail two months in advance. If you don't receive a letter, please call our office to schedule the follow-up appointment.   Any Other Special Instructions Will Be Listed Below (If Applicable).   If you need a refill on your cardiac medications before your next appointment, please call your pharmacy.   

## 2016-12-04 NOTE — Progress Notes (Signed)
Cardiology Office Note   Date:  12/04/2016   ID:  [name], DOB 1986-02-29, MRN [id number]  PCP:  Pearline Cablesopland, Jessica C, MD  Cardiologist:   Lorine BearsMuhammad Arida, MD   Chief Complaint  Patient presents with  . Follow-up      History of Present Illness: [name] is a 31 y.o. male who presents for a follow up visit regarding coronary artery disease and bicuspid aortic valve.  He had anterior STEMI in 05/2013 . Cardiac catheterization revealed severe one vessel CAD with an occluded mid LAD thought to be plaque rupture. No other disease noted. He underwent Successful aspiration thrombectomy and drug-eluting stent placement x2 to the mid LAD. A second stent was placed due to suspected edge dissection versus spasm.  2D echo revealed EF of 40-45%, akinesis of the mid-distalanteroseptal and apical myocardium and grade one diastolic dysfuntion.  UDS was positive for amphetamines . He reported taking energy pills from a friend.  He has been doing very well with no chest pain, shortness of breath or palpitations. He exercises on a regular basis with no exertional symptoms. He had some numbness last year that was likely anxiety related. Carotid Doppler was normal. Most recent echocardiogram in January 2017 showed normalization of LV systolic function, bicuspid aortic valve without significant stenosis or regurgitation.  Past Medical History:  Diagnosis Date  . Coronary artery disease   . Hidradenitis   . History of Doppler ultrasound    a. Carotid US 3/17: normal carotid arteries bilaterally  . Hyperlipidemia   . Ischemic cardiomyopathy    Ejection fraction of 40-45% due to anterior myocardial infarction    Past Surgical History:  Procedure Laterality Date  . ANKLE SURGERY Left   . CARDIAC CATHETERIZATION    . CORONARY ANGIOPLASTY    . LEFT HEART CATHETERIZATION WITH CORONARY ANGIOGRAM N/A 06/14/2013   Procedure: LEFT HEART CATHETERIZATION WITH CORONARY ANGIOGRAM;  Surgeon:  Iran OuchMuhammad A Arida, MD;  Location: [facility];  Service: Cardiovascular;  Laterality: N/A;  . PERCUTANEOUS CORONARY STENT INTERVENTION (PCI-S)  06/14/2013   Procedure: PERCUTANEOUS CORONARY STENT INTERVENTION (PCI-S);  Surgeon: Iran OuchMuhammad A Arida, MD;  [facility];  Service: Cardiovascular;;  mid LAD 2DES placed  . PILONIDAL CYST EXCISION    . WISDOM TOOTH EXTRACTION       Current Outpatient Prescriptions  Medication Sig Dispense Refill  . aspirin EC 81 MG EC tablet Take 1 tablet (81 mg total) by mouth daily.    Marland Kitchen. atorvastatin (LIPITOR) 10 MG tablet Take 1 tablet (10 mg total) by mouth daily at 6 PM. 90 tablet 0  . lisinopril (PRINIVIL,ZESTRIL) 5 MG tablet Take 1 tablet (5 mg total) by mouth daily. 90 tablet 0  . metoprolol tartrate (LOPRESSOR) 25 MG tablet Take 1 tablet (25 mg total) by mouth 2 (two) times daily. 180 tablet 0  . Multiple Vitamin (MULTI-VITAMINS) TABS Take 1 tablet by mouth daily.    . nitroGLYCERIN (NITROSTAT) 0.4 MG SL tablet Place 1 tablet (0.4 mg total) under the tongue every 5 (five) minutes as needed for chest pain. 25 tablet 12   No current facility-administered medications for this visit.     Allergies:   Penicillins    Social History:  The patient  reports that he has never smoked. He has never used smokeless tobacco. He reports that he drinks alcohol. He reports that he does not use drugs.   Family History:  The patient's family history includes Coronary artery disease  in his paternal grandmother; Diabetes in his paternal grandmother; Headache in his father; Healthy in his brother and mother.    ROS:  Please see the history of present illness.   Otherwise, review of systems are positive for none.   All other systems are reviewed and negative.    PHYSICAL EXAM: VS:  BP 96/62   Pulse (!) 59   Ht 5\' 7"  (1.702 m)   Wt 169 lb (76.7 kg)   BMI 26.47 kg/m  , BMI Body mass index is 26.47 kg/m. GEN: Well nourished, well developed, in no acute distress    HEENT: normal  Neck: no JVD, carotid bruits, or masses Cardiac: RRR; no murmurs, rubs, or gallops,no edema  Respiratory:  clear to auscultation bilaterally, normal work of breathing GI: soft, nontender, nondistended, + BS MS: no deformity or atrophy  Skin: warm and dry, no rash Neuro:  Strength and sensation are intact Psych: euthymic mood, full affect   EKG:  EKG is ordered today. The ekg ordered today demonstrates  sinus bradycardia with old septal infarct.   Recent Labs: No results found for requested labs within last 8760 hours.    Lipid Panel    Component Value Date/Time   CHOL 141 12/14/2014 1129   TRIG 158.0 (H) 12/14/2014 1129   HDL 54.80 12/14/2014 1129   CHOLHDL 3 12/14/2014 1129   VLDL 31.6 12/14/2014 1129   LDLCALC 55 12/14/2014 1129      Wt Readings from Last 3 Encounters:  12/04/16 169 lb (76.7 kg)  08/22/15 155 lb (70.3 kg)  08/15/15 155 lb (70.3 kg)      No flowsheet data found.    ASSESSMENT AND PLAN:  1.  Coronary artery disease involving native coronary arteries without angina: He is doing very well. Continue medical therapy.  2. Bicuspid aortic valve, no associated stenosis or regurgitation. I do not appreciate any cardiac murmurs by physical exam. Thus, no need for an echocardiogram.  3. Hyperlipidemia: Currently on atorvastatin 10 mg once daily. Most recent LDL was 55 and 2016. I requested a follow-up lipid and liver profile.  4. Ischemic cardiomyopathy: Most recent ejection fraction was normal with mild distal anterior and apical hypokinesis. Continue small dose metoprolol and lisinopril.    Disposition:   FU with me in 1 year  Signed,  Lorine Bears, MD  12/04/2016 10:42 AM    [facility]

## 2017-02-01 ENCOUNTER — Other Ambulatory Visit: Payer: Self-pay

## 2017-02-01 MED ORDER — ATORVASTATIN CALCIUM 10 MG PO TABS: 10.0000 mg | ORAL_TABLET | Freq: Every day | ORAL | 2 refills | Status: DC

## 2017-02-01 MED ORDER — METOPROLOL TARTRATE 25 MG PO TABS: 25.0000 mg | ORAL_TABLET | Freq: Two times a day (BID) | ORAL | 2 refills | Status: DC

## 2017-02-01 MED ORDER — LISINOPRIL 5 MG PO TABS: 5.0000 mg | ORAL_TABLET | Freq: Every day | ORAL | 2 refills | Status: DC

## 2017-10-29 ENCOUNTER — Other Ambulatory Visit: Payer: Self-pay

## 2017-10-29 MED ORDER — METOPROLOL TARTRATE 25 MG PO TABS: 25.0000 mg | ORAL_TABLET | Freq: Two times a day (BID) | ORAL | 1 refills | Status: DC

## 2017-10-30 ENCOUNTER — Other Ambulatory Visit: Payer: Self-pay

## 2017-10-30 MED ORDER — METOPROLOL TARTRATE 25 MG PO TABS: 25.0000 mg | ORAL_TABLET | Freq: Two times a day (BID) | ORAL | 0 refills | Status: DC

## 2017-11-04 ENCOUNTER — Other Ambulatory Visit: Payer: Self-pay

## 2017-11-04 MED ORDER — ATORVASTATIN CALCIUM 10 MG PO TABS: 10.0000 mg | ORAL_TABLET | Freq: Every day | ORAL | 0 refills | Status: DC

## 2017-11-04 NOTE — Telephone Encounter (Signed)
Rx(s) sent to pharmacy electronically.  

## 2017-11-13 ENCOUNTER — Other Ambulatory Visit: Payer: Self-pay

## 2017-11-13 IMAGING — CT CT HEAD W/O CM
1 series · 16 of 29 positions shown, 20 images · non-contrast
Comparison: None.

CLINICAL DATA: RIGHT arm paresthesias that last 1 hour.

EXAM:
CT HEAD WITHOUT CONTRAST
TECHNIQUE: Contiguous axial images were obtained from the base of the skull
through the vertex without intravenous contrast.

[Series 2: head 5.0 h37s · axial · 0.42mm/px · z∈[+135,+265]mm · 16 of 29 slices shown, 20 images]
[im 2/29  brain]
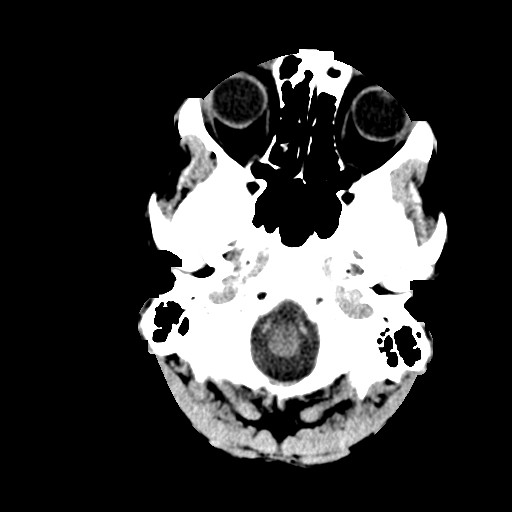
[im 2/29  bone]
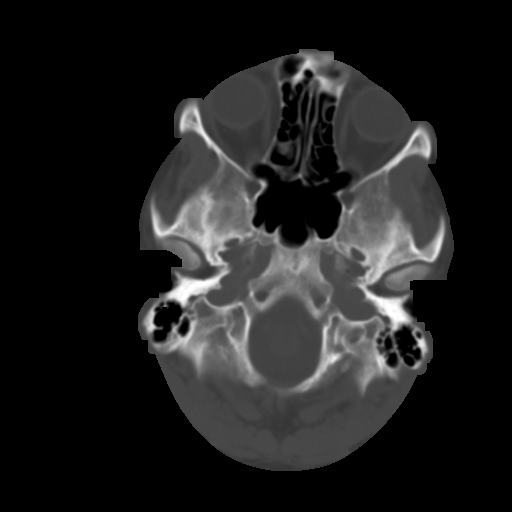
[im 4/29  brain]
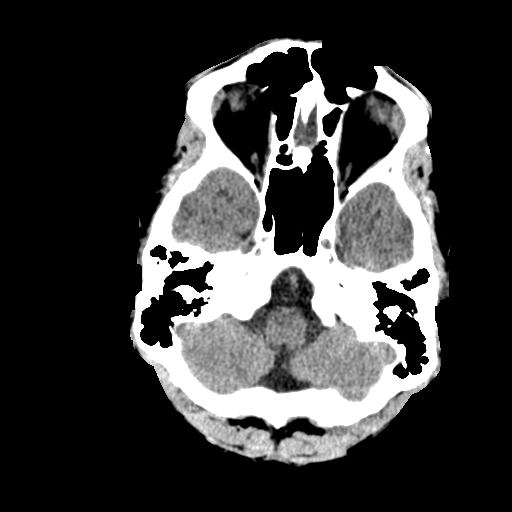
[im 6/29  brain]
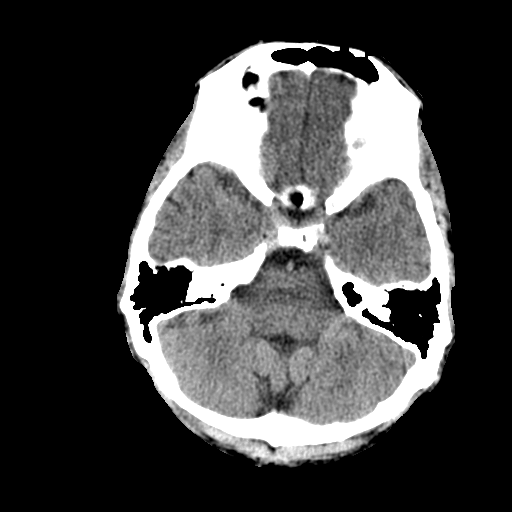
[im 7/29  brain]
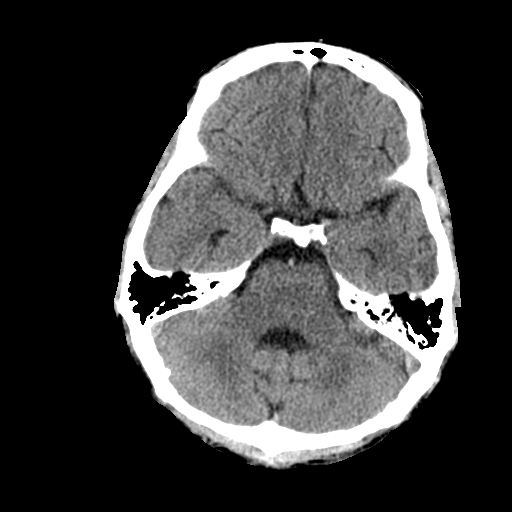
[im 9/29  brain]
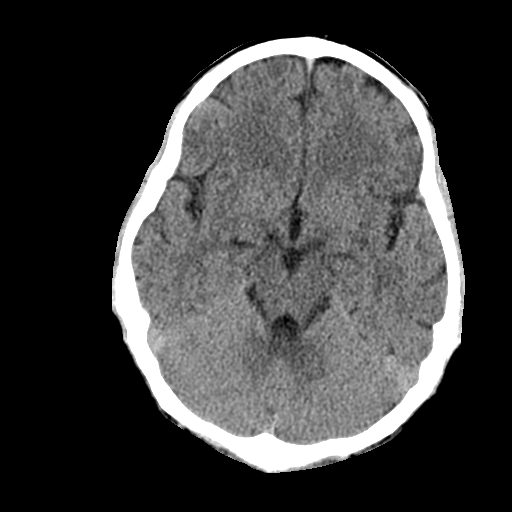
[im 9/29  bone]
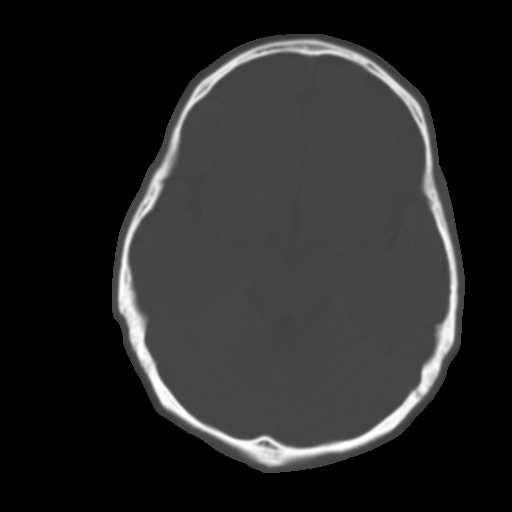
[im 11/29  brain]
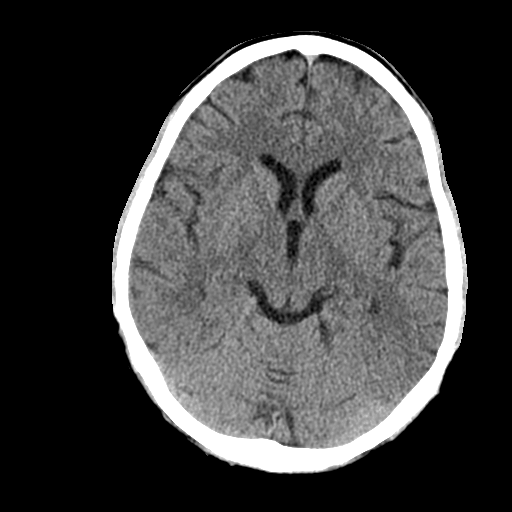
[im 12/29  brain]
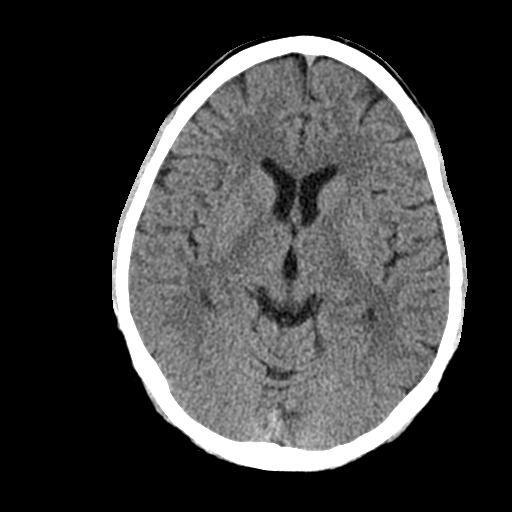
[im 14/29  brain]
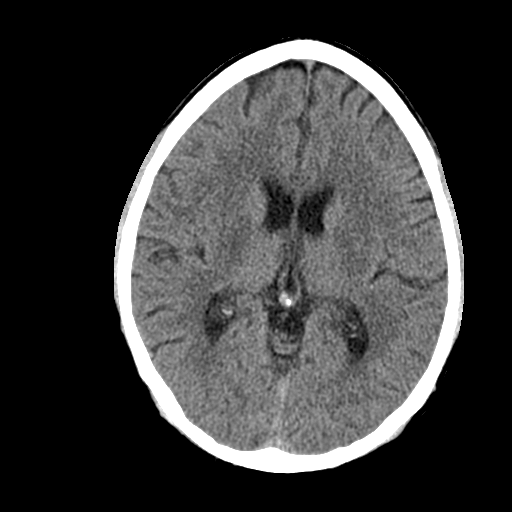
[im 16/29  brain]
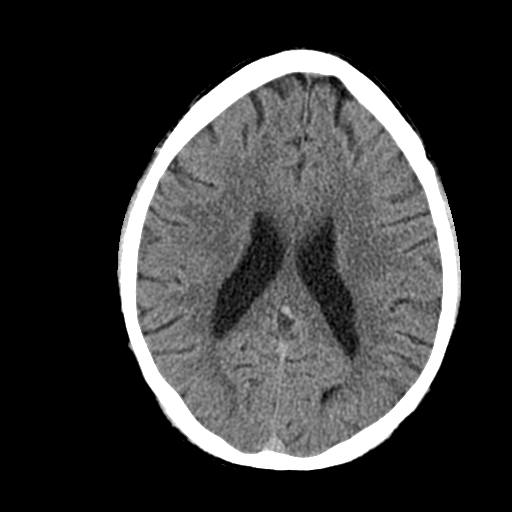
[im 16/29  bone]
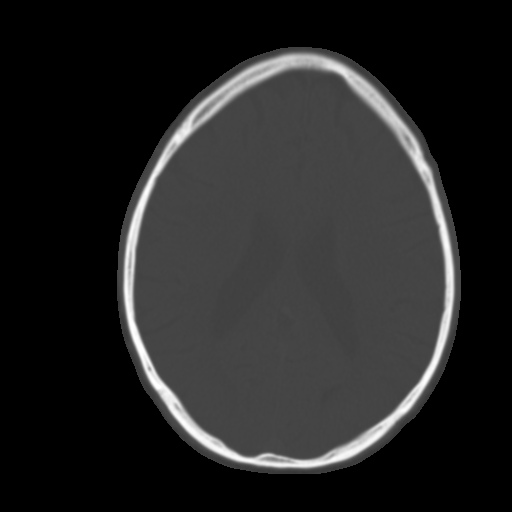
[im 18/29  brain]
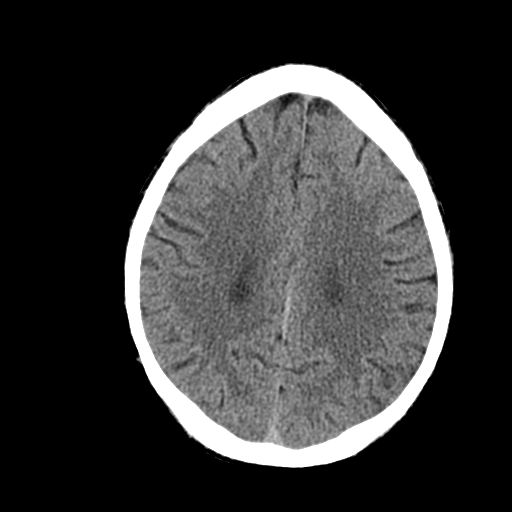
[im 19/29  brain]
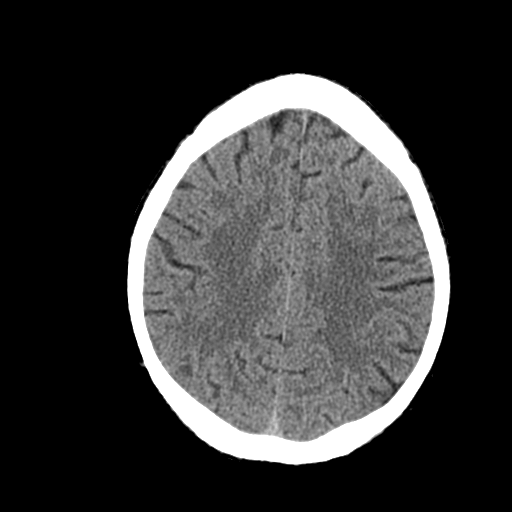
[im 21/29  brain]
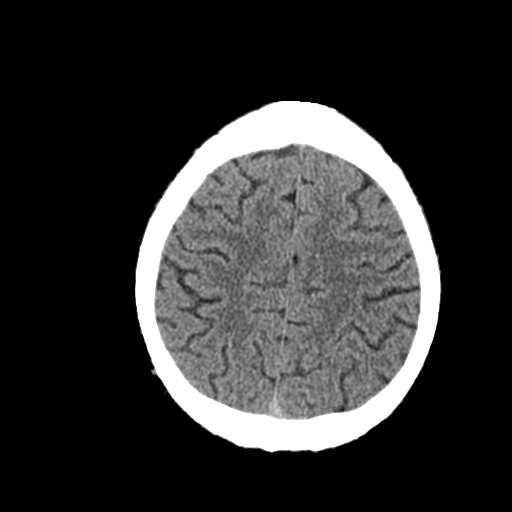
[im 23/29  brain]
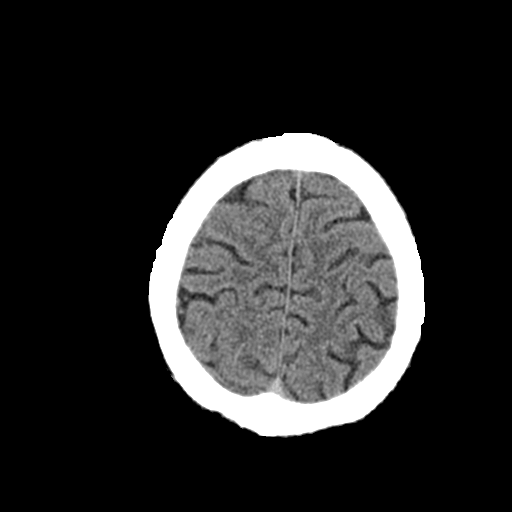
[im 23/29  bone]
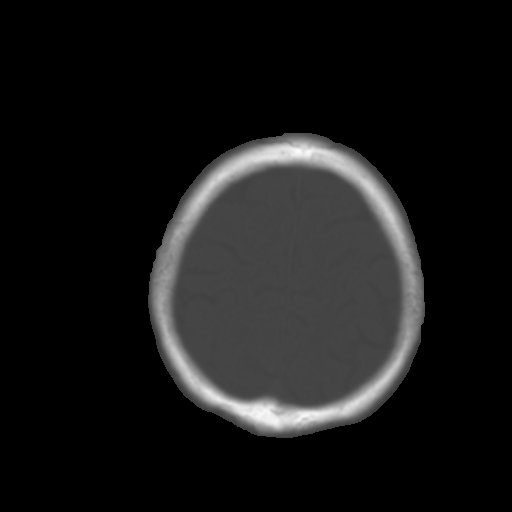
[im 24/29  brain]
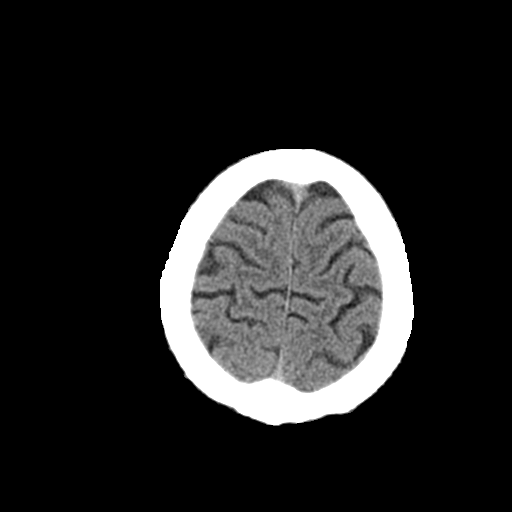
[im 26/29  brain]
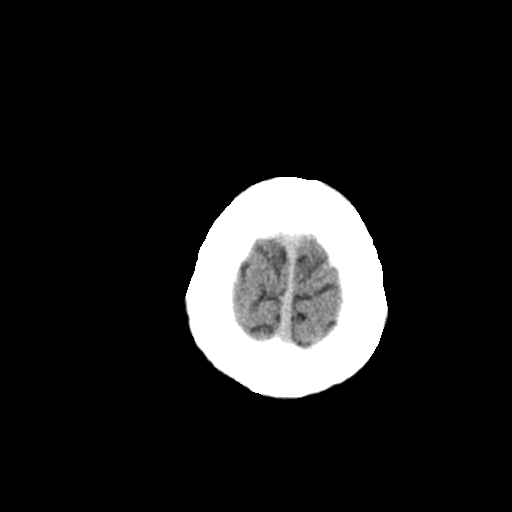
[im 28/29  brain]
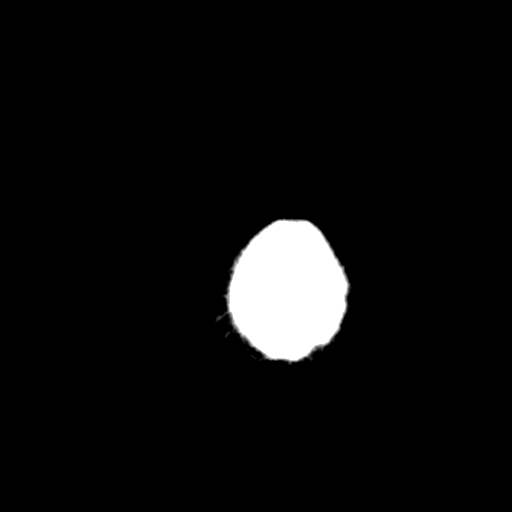

[16 of 29 positions shown; findings below may reference images not displayed]

FINDINGS: No evidence for acute infarction, hemorrhage, mass lesion,
hydrocephalus, or extra-axial fluid. No atrophy or white matter
disease. Intact calvarium. No acute sinus or mastoid disease.
IMPRESSION: Negative.

## 2017-11-14 ENCOUNTER — Other Ambulatory Visit: Payer: Self-pay | Admitting: *Deleted

## 2017-11-14 MED ORDER — LISINOPRIL 5 MG PO TABS: 5.0000 mg | ORAL_TABLET | Freq: Every day | ORAL | 0 refills | Status: DC

## 2017-11-14 MED ORDER — METOPROLOL TARTRATE 25 MG PO TABS: 25.0000 mg | ORAL_TABLET | Freq: Two times a day (BID) | ORAL | 0 refills | Status: DC

## 2017-11-19 ENCOUNTER — Other Ambulatory Visit: Payer: Self-pay

## 2017-11-19 IMAGING — DX DG CERVICAL SPINE COMPLETE 4+V
6 series · 6 of 6 positions shown · non-contrast
Comparison: None

CLINICAL DATA: Numbness and tingling in hands and feet, impaired
sensations for 1 month, BILATERAL back pain of unspecified location,
paresthesias

EXAM:
CERVICAL SPINE - COMPLETE 4+ VIEW

[c-spine lat]
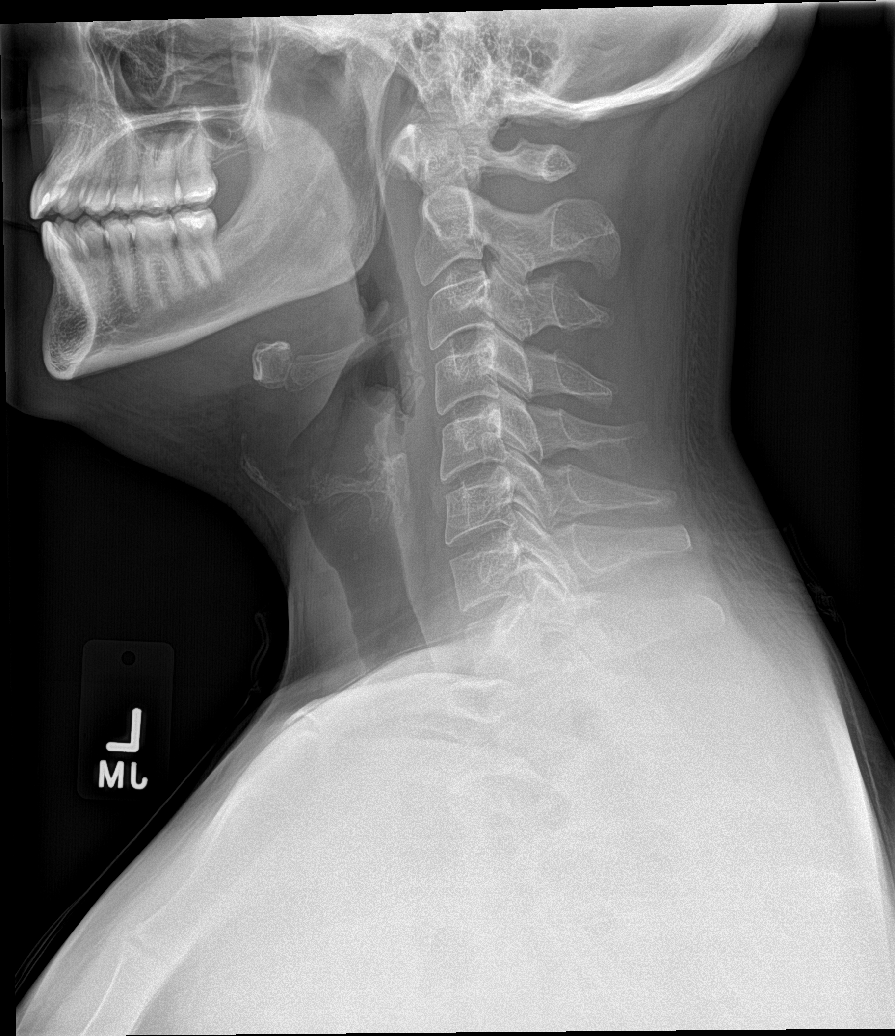

[c-spine obl (1 of 2)]
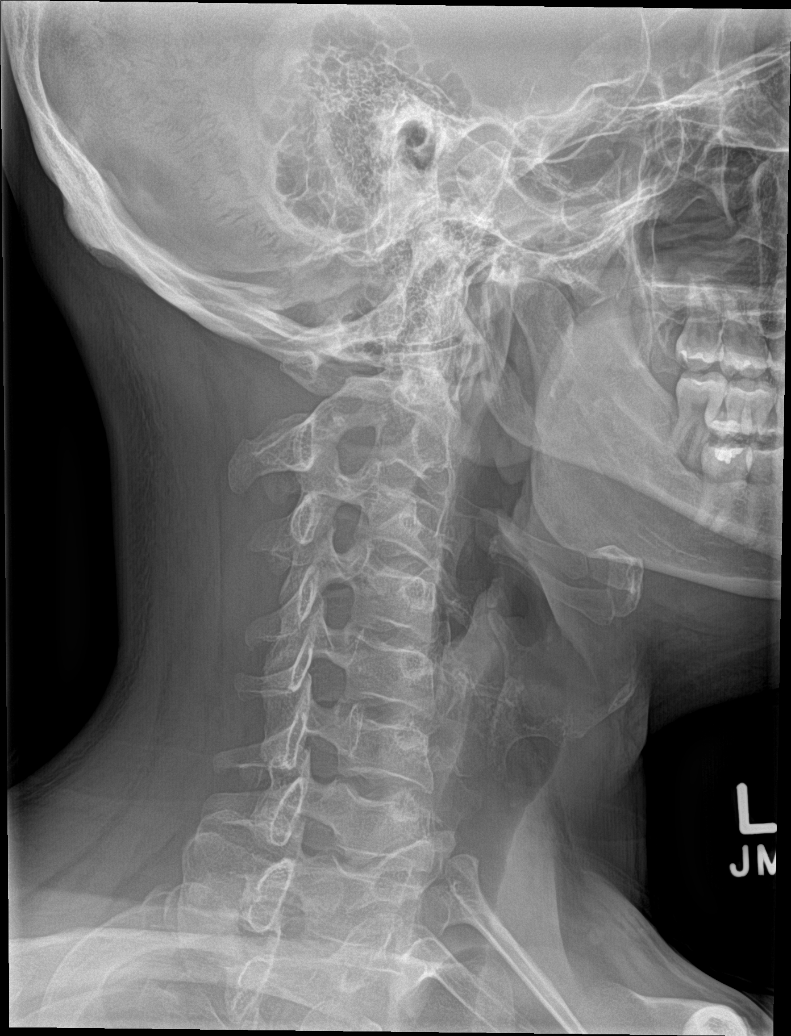

[c-spine obl (2 of 2)]
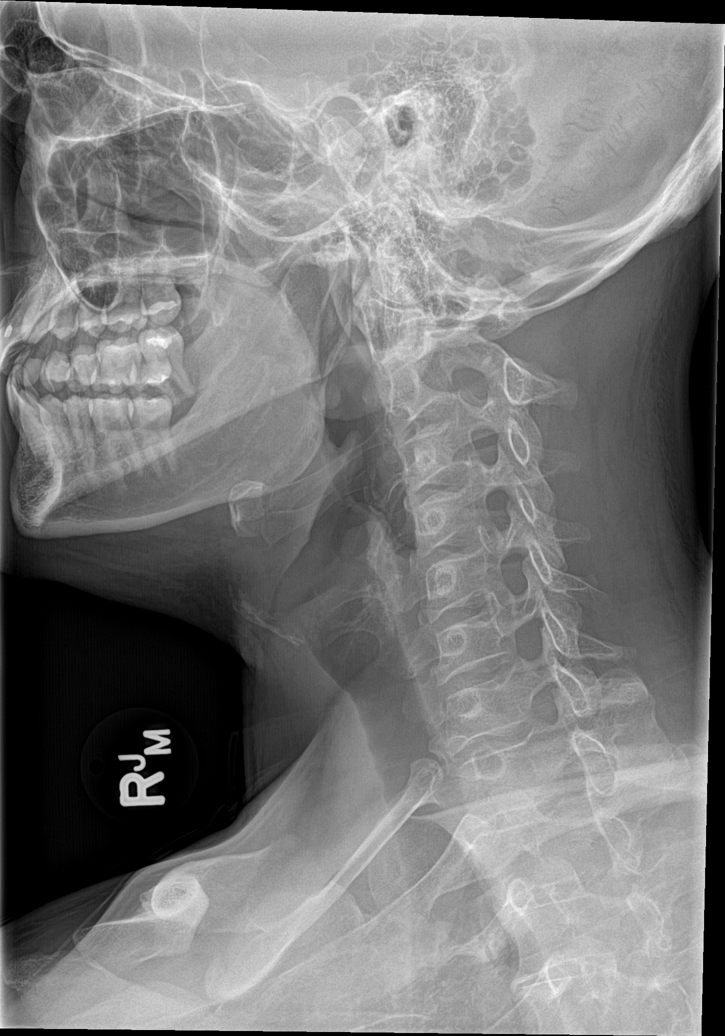

[c-spine ap]
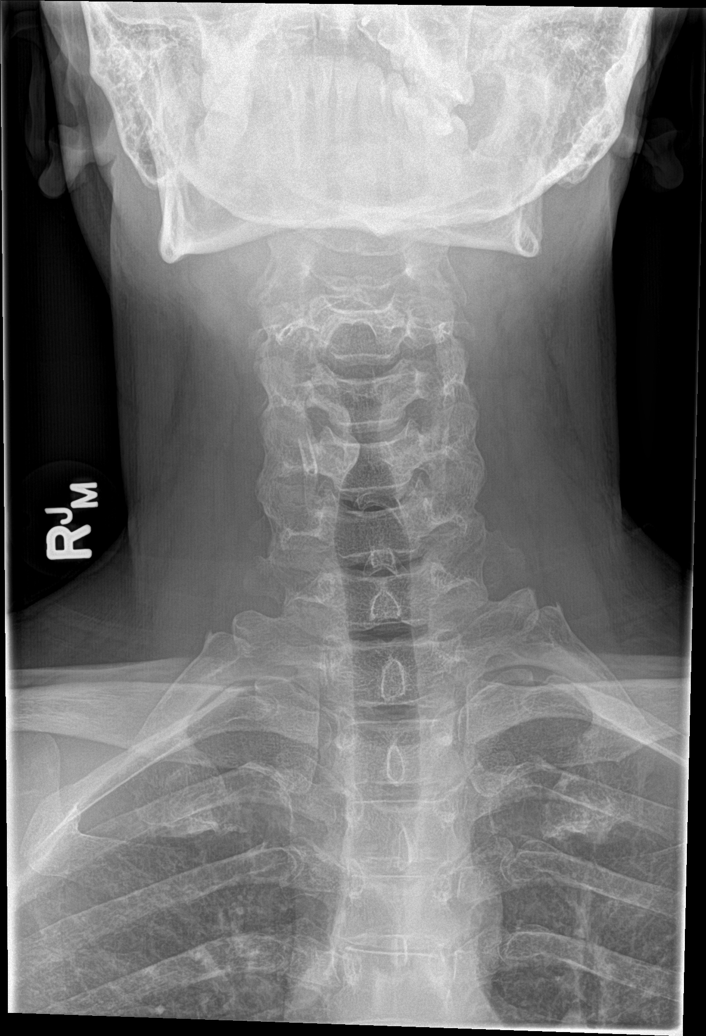

[c-spine open mouth]
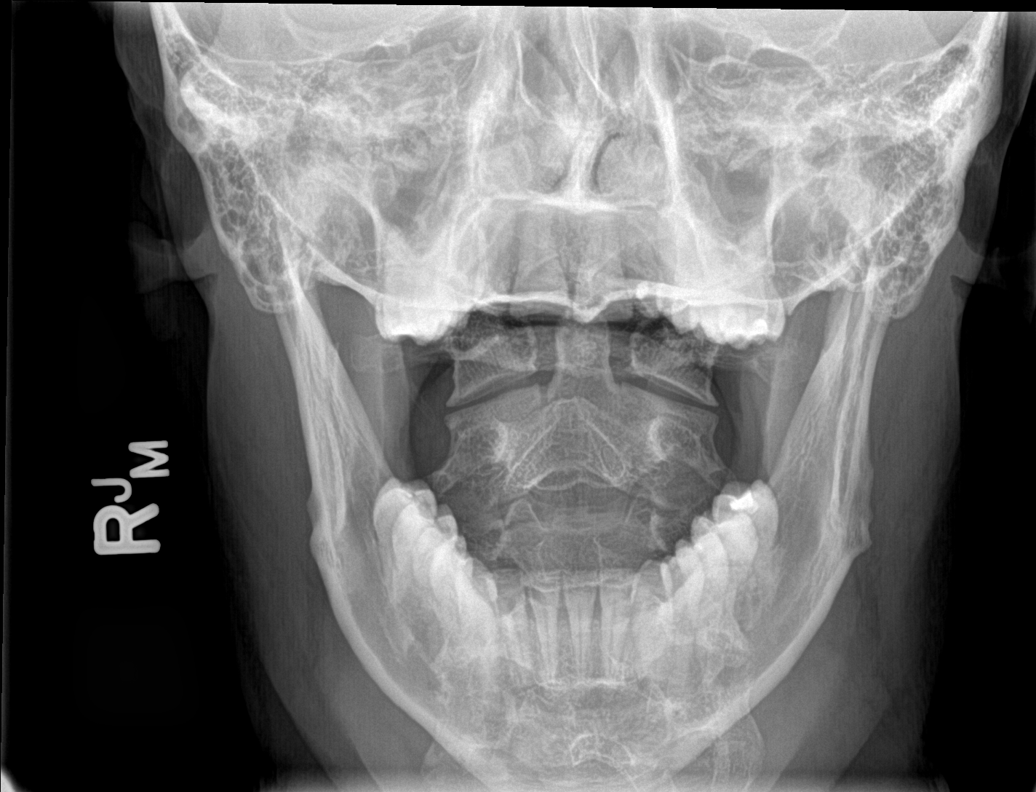

[c-spine swimmers]
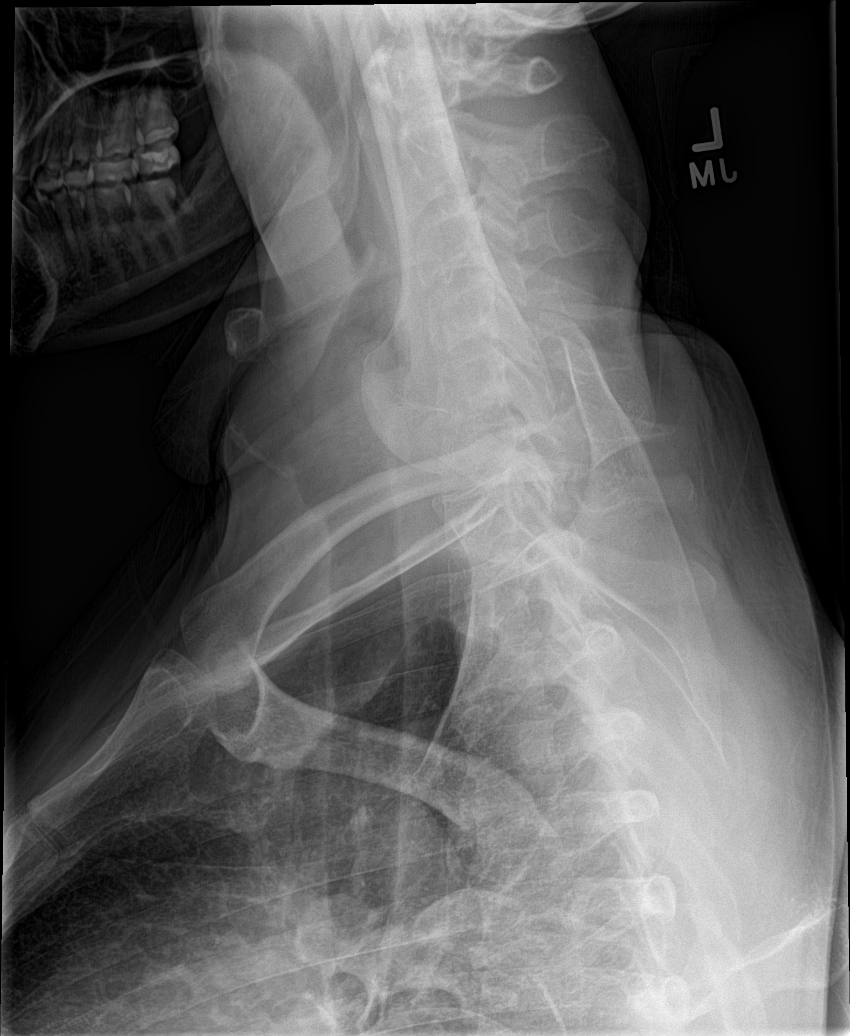

[6 of 6 positions shown; findings below may reference images not displayed]

FINDINGS: Prevertebral soft tissues normal thickness.

Bones appear demineralized.

Straightening of cervical lordosis.

Vertebral body and disc space heights maintained.

Bony neural foramina patent.

No acute fracture, subluxation, or bone destruction.

Lung apices clear.

C1-C2 alignment normal.
IMPRESSION: Straightening of cervical lordosis question muscle spasm.

Mild osseous demineralization without acute bony abnormalities.

## 2017-11-19 MED ORDER — METOPROLOL TARTRATE 25 MG PO TABS: 25.0000 mg | ORAL_TABLET | Freq: Two times a day (BID) | ORAL | 0 refills | Status: DC

## 2018-02-24 ENCOUNTER — Other Ambulatory Visit: Payer: Self-pay | Admitting: *Deleted

## 2018-02-24 MED ORDER — LISINOPRIL 5 MG PO TABS: 5.0000 mg | ORAL_TABLET | Freq: Every day | ORAL | 0 refills | Status: DC

## 2018-06-09 ENCOUNTER — Other Ambulatory Visit: Payer: Self-pay | Admitting: *Deleted

## 2018-06-09 MED ORDER — LISINOPRIL 5 MG PO TABS: 5.0000 mg | ORAL_TABLET | Freq: Every day | ORAL | 0 refills | Status: DC

## 2018-07-09 ENCOUNTER — Other Ambulatory Visit: Payer: Self-pay

## 2018-07-09 MED ORDER — LISINOPRIL 5 MG PO TABS: 5.0000 mg | ORAL_TABLET | Freq: Every day | ORAL | 0 refills | Status: DC

## 2018-08-10 ENCOUNTER — Other Ambulatory Visit: Payer: Self-pay | Admitting: Cardiovascular Disease

## 2018-08-11 NOTE — Telephone Encounter (Signed)
Refill Request.  

## 2018-10-06 ENCOUNTER — Other Ambulatory Visit: Payer: Self-pay

## 2018-10-06 MED ORDER — ATORVASTATIN CALCIUM 10 MG PO TABS: 10.0000 mg | ORAL_TABLET | Freq: Every day | ORAL | 0 refills | Status: DC

## 2018-10-06 NOTE — Telephone Encounter (Signed)
Rx(s) sent to pharmacy electronically.  

## 2018-10-07 ENCOUNTER — Other Ambulatory Visit: Payer: Self-pay

## 2018-10-07 MED ORDER — METOPROLOL TARTRATE 25 MG PO TABS: 25.0000 mg | ORAL_TABLET | Freq: Two times a day (BID) | ORAL | 0 refills | Status: DC

## 2018-10-31 ENCOUNTER — Telehealth: Payer: Self-pay | Admitting: Cardiovascular Disease

## 2018-10-31 NOTE — Telephone Encounter (Signed)
New message   The patient states that he does not feel comfortable going back to work he is a high risk patient. He would like to discuss getting a doctor's note. Please call to discuss.

## 2018-10-31 NOTE — Telephone Encounter (Signed)
Left a message for the patient to call back if anything further was needed. The patient has an appointment on 7/28 with Dr. Fletcher Anon. He has not been seen since 2018 and will need to keep that appointment before any decision is made.

## 2018-10-31 NOTE — Telephone Encounter (Signed)
The patient called back stating that he needs a work letter by 11/12/2018 stating that he cannot return to work yet due to the covid pandemic and because he is high risk. 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   He has been advised that he may also try asking his PCP for a note. He has not been seen by Dr. Fletcher Anon in 2 years but does have an appointment on 7/28.

## 2018-11-03 ENCOUNTER — Other Ambulatory Visit: Payer: Self-pay | Admitting: Cardiovascular Disease

## 2018-11-03 NOTE — Telephone Encounter (Signed)
We can provide a note that he has known history of coronary artery disease with previous myocardial infarction.  I understand his point of view.  However, I cannot comment on his ability to go back to work as there might have to be some other guidelines for situation like this.

## 2018-11-03 NOTE — Telephone Encounter (Signed)
Refill Request.  

## 2018-11-05 ENCOUNTER — Encounter: Payer: Self-pay | Admitting: *Deleted

## 2018-11-05 NOTE — Telephone Encounter (Signed)
Call placed to the patient. A letter will be mailed that states he has a known history of coronary artery disease with previous myocardial infarction.   His appointment has been moved up to 7/9 with Dr. Fletcher Anon.

## 2018-11-27 ENCOUNTER — Telehealth (INDEPENDENT_AMBULATORY_CARE_PROVIDER_SITE_OTHER): Payer: Managed Care, Other (non HMO) | Admitting: Cardiovascular Disease

## 2018-11-27 ENCOUNTER — Telehealth: Payer: Self-pay

## 2018-11-27 ENCOUNTER — Encounter: Payer: Self-pay | Admitting: Cardiovascular Disease

## 2018-11-27 VITALS — BP 124/73 | HR 80 | Ht 66.0 in | Wt 159.0 lb

## 2018-11-27 DIAGNOSIS — Q231 Congenital insufficiency of aortic valve: Secondary | ICD-10-CM | POA: Diagnosis not present

## 2018-11-27 DIAGNOSIS — I255 Ischemic cardiomyopathy: Secondary | ICD-10-CM

## 2018-11-27 DIAGNOSIS — Z79899 Other long term (current) drug therapy: Secondary | ICD-10-CM

## 2018-11-27 DIAGNOSIS — Z955 Presence of coronary angioplasty implant and graft: Secondary | ICD-10-CM

## 2018-11-27 DIAGNOSIS — I359 Nonrheumatic aortic valve disorder, unspecified: Secondary | ICD-10-CM

## 2018-11-27 DIAGNOSIS — I2510 Atherosclerotic heart disease of native coronary artery without angina pectoris: Secondary | ICD-10-CM

## 2018-11-27 DIAGNOSIS — I252 Old myocardial infarction: Secondary | ICD-10-CM

## 2018-11-27 DIAGNOSIS — E785 Hyperlipidemia, unspecified: Secondary | ICD-10-CM

## 2018-11-27 MED ORDER — NITROGLYCERIN 0.4 MG SL SUBL: 0.4000 mg | SUBLINGUAL_TABLET | SUBLINGUAL | 1 refills | Status: DC | PRN

## 2018-11-27 NOTE — Progress Notes (Signed)
Virtual Visit via Video Note   This visit type was conducted due to national recommendations for restrictions regarding the COVID-19 Pandemic (e.g. social distancing) in an effort to limit this patient's exposure and mitigate transmission in our community.  Due to his co-morbid illnesses, this patient is at least at moderate risk for complications without adequate follow up.  This format is felt to be most appropriate for this patient at this time.  All issues noted in this document were discussed and addressed.  A limited physical exam was performed with this format.  Please refer to the patient's chart for his consent to telehealth [facility].   Date:  11/27/2018   ID:  [name], DOB December 20, 1985, MRN [id number]  Patient Location: Home Provider Location: Office  PCP:  Pearline Cablesopland, Jessica C, MD  Cardiologist:  Lorine BearsMuhammad Henrique Parekh, MD  Electrophysiologist:  None   Evaluation Performed:  Follow-Up Visit  Chief Complaint: Doing well with no complaints.  History of Present Illness:    [name] Linatrick M Emigh is a 33 y.o. male who was seen via video visit for follow-up regarding coronary artery disease and bicuspid aortic valve.  He had anterior STEMI in 05/2013 . Cardiac catheterization revealed severe one vessel CAD with an occluded mid LAD thought to be due to plaque rupture. No other disease noted. He underwent Successful aspiration thrombectomy and drug-eluting stent placement x2 to the mid LAD. A second stent was placed due to suspected edge dissection versus spasm.  2D echo revealed EF of 40-45%, akinesis of the mid-distalanteroseptal and apical myocardium and grade one diastolic dysfuntion.  UDS was positive for amphetamines . He reported taking energy pills from a friend.  He is also known to have bicuspid aortic valve with no significant stenosis or regurgitation.  Most recent echocardiogram in January 2017 showed normalization of LV systolic function, bicuspid aortic valve without  significant stenosis or regurgitation.  He has been doing well with no recent chest pain, shortness of breath or palpitations.  He exercises regularly with no exertional symptoms.  He is currently working at a country [employer] but is concerned about going back to work due to risk of COVID exposure and his cardiovascular history.   The patient does not have symptoms concerning for COVID-19 infection (fever, chills, cough, or new shortness of breath).    Past Medical History:  Diagnosis Date  . Coronary artery disease   . Hidradenitis   . History of Doppler ultrasound    a. Carotid US 3/17: normal carotid arteries bilaterally  . Hyperlipidemia   . Ischemic cardiomyopathy    Ejection fraction of 40-45% due to anterior myocardial infarction   Past Surgical History:  Procedure Laterality Date  . ANKLE SURGERY Left   . CARDIAC CATHETERIZATION    . CORONARY ANGIOPLASTY    . LEFT HEART CATHETERIZATION WITH CORONARY ANGIOGRAM N/A 06/14/2013   Procedure: LEFT HEART CATHETERIZATION WITH CORONARY ANGIOGRAM;  Surgeon: Iran OuchMuhammad A Goebel Hellums, MD;  Location: [facility] C[facility];  Service: Cardiovascular;  Laterality: N/A;  . PERCUTANEOUS CORONARY STENT INTERVENTION (PCI-S)  06/14/2013   Procedure: PERCUTANEOUS CORONARY STENT INTERVENTION (PCI-S);  Surgeon: Iran OuchMuhammad A Yael Angerer, MD;  [facility];  Service: Cardiovascular;;  mid LAD 2DES placed  . PILONIDAL CYST EXCISION    . WISDOM TOOTH EXTRACTION       Current Meds  Medication Sig  . aspirin EC 81 MG EC tablet Take 1 tablet (81 mg total) by mouth daily.  Marland Kitchen. atorvastatin (LIPITOR) 10 MG tablet  Take 1 tablet (10 mg total) by mouth daily at 6 PM. NEEDS APPOINTMENT FOR FUTURE REFILLS  . lisinopril (ZESTRIL) 5 MG tablet TAKE 1 TABLET(5 MG) BY MOUTH DAILY  . metoprolol tartrate (LOPRESSOR) 25 MG tablet TAKE 1 TABLET BY MOUTH TWICE DAILY.  . Multiple Vitamin (MULTI-VITAMINS) TABS Take 1 tablet by mouth daily.  . nitroGLYCERIN (NITROSTAT) 0.4 MG SL tablet Place 1  tablet (0.4 mg total) under the tongue every 5 (five) minutes as needed for chest pain.     Allergies:   Penicillins   Social History   Tobacco Use  . Smoking status: Never Smoker  . Smokeless tobacco: Never Used  Substance Use Topics  . Alcohol use: Yes    Alcohol/week: 0.0 standard drinks    Comment: once a week - not currently   . Drug use: No     Family Hx: The patient's family history includes Coronary artery disease in his paternal grandmother; Diabetes in his paternal grandmother; Headache in his father; Healthy in his brother and mother.  ROS:   Please see the history of present illness.     All other systems reviewed and are negative.   Prior CV studies:   The following studies were reviewed today:  Reviewed most recent echocardiogram with him  Labs/Other Tests and Data Reviewed:    EKG:  No ECG reviewed.  Recent Labs: No results found for requested labs within last 8760 hours.   Recent Lipid Panel Lab Results  Component Value Date/Time   CHOL 135 12/04/2016 12:00 AM   TRIG 94 12/04/2016 12:00 AM   HDL 56 12/04/2016 12:00 AM   CHOLHDL 2.4 12/04/2016 12:00 AM   CHOLHDL 3 12/14/2014 11:29 AM   LDLCALC 60 12/04/2016 12:00 AM    Wt Readings from Last 3 Encounters:  11/27/18 159 lb (72.1 kg)  12/04/16 169 lb (76.7 kg)  08/22/15 155 lb (70.3 kg)     Objective:    Vital Signs:  BP 124/73 (BP Location: Left Arm, Patient Position: Sitting, Cuff Size: Normal)   Pulse 80   Ht 5\' 6"  (1.676 m)   Wt 159 lb (72.1 kg)   BMI 25.66 kg/m    VITAL SIGNS:  reviewed GEN:  no acute distress EYES:  sclerae anicteric, EOMI - Extraocular Movements Intact RESPIRATORY:  normal respiratory effort, symmetric expansion SKIN:  no rash, lesions or ulcers. MUSCULOSKELETAL:  no obvious deformities. NEURO:  alert and oriented x 3, no obvious focal deficit PSYCH:  normal affect  ASSESSMENT & PLAN:    1.  Coronary artery disease involving native coronary arteries without  angina: He is doing very well. Continue medical therapy.  2. Bicuspid aortic valve, no associated stenosis or regurgitation.  He is due for an echocardiogram which was requested.  3. Hyperlipidemia: Currently on atorvastatin 10 mg once daily. Most recent LDL was 55 and 2016. I requested a follow-up lipid and liver profile.  4. Ischemic cardiomyopathy: Most recent ejection fraction was normal with mild distal anterior and apical hypokinesis. Continue small dose metoprolol and lisinopril.  I requested routine labs.  The patient asked about his risk profile in regards to COVID-19.  He is likely in the intermediate risk category given his cardiovascular history.  He is not comfortable going back to work yet.  COVID-19 Education: The signs and symptoms of COVID-19 were discussed with the patient and how to seek care for testing (follow up with PCP or arrange E-visit).  The importance of social distancing was discussed today.  Time:   Today, I have spent 12 minutes with the patient with telehealth technology discussing the above problems.     Medication Adjustments/Labs and Tests Ordered: Current medicines are reviewed at length with the patient today.  Concerns regarding medicines are outlined above.   Tests Ordered: No orders of the defined types were placed in this encounter.   Medication Changes: No orders of the defined types were placed in this encounter.   Follow Up:  In Person in 6 month(s)  Signed, Kathlyn Sacramento, MD  11/27/2018 1:03 PM    [place]

## 2018-11-27 NOTE — Patient Instructions (Signed)
Medication Instructions:  Continue same medications If you need a refill on your cardiac medications before your next appointment, please call your pharmacy.   Lab work: CBC, basic metabolic profile, lipid and liver profile  If you have labs (blood work) drawn today and your tests are completely normal, you will receive your results only by: Marland Kitchen MyChart Message (if you have MyChart) OR . A paper copy in the mail If you have any lab test that is abnormal or we need to change your treatment, we will call you to review the results.  Testing/Procedures: Schedule an echocardiogram for aortic valve disease  Follow-Up: At [facility], you and your health needs are our priority.  As part of our continuing mission to provide you with exceptional heart care, we have created designated Provider Care Teams.  These Care Teams include your primary Cardiologist (physician) and Advanced Practice Providers (APPs -  Physician Assistants and Nurse Practitioners) who all work together to provide you with the care you need, when you need it. You will need a follow up appointment in 6 months.  Please call our office 2 months in advance to schedule this appointment.  You may see Kathlyn Sacramento, MD or one of the following Advanced Practice Providers on your designated Care Team:   Kerin Ransom, PA-C Roby Lofts, [place] . Sande Rives, PA-C

## 2018-11-27 NOTE — Telephone Encounter (Signed)
Virtual Visit Pre-Appointment Phone Call  "(Name), I am calling you today to discuss your upcoming appointment. We are currently trying to limit exposure to the virus that causes COVID-19 by seeing patients at home rather than in the office."  1. "What is the BEST phone number to call the day of the visit?" - include this in appointment notes  2. "Do you have or have access to (through a family member/friend) a smartphone with video capability that we can use for your visit?" a. If yes - list this number in appt notes as "cell" (if different from BEST phone #) and list the appointment type as a VIDEO visit in appointment notes b. If no - list the appointment type as a PHONE visit in appointment notes  3. Confirm consent - "In the setting of the current Covid19 crisis, you are scheduled for a (phone or video) visit with your provider on (date) at (time).  Just as we do with many in-office visits, in order for you to participate in this visit, we must obtain consent.  If you'd like, I can send this to your mychart (if signed up) or email for you to review.  Otherwise, I can obtain your verbal consent now.  All virtual visits are billed to your insurance company just like a normal visit would be.  By agreeing to a virtual visit, we'd like you to understand that the technology does not allow for your provider to perform an examination, and thus may limit your provider's ability to fully assess your condition. If your provider identifies any concerns that need to be evaluated in person, we will make arrangements to do so.  Finally, though the technology is pretty good, we cannot assure that it will always work on either your or our end, and in the setting of a video visit, we may have to convert it to a phone-only visit.  In either situation, we cannot ensure that we have a secure connection.  Are you willing to proceed?" STAFF: Did the patient verbally acknowledge consent to telehealth visit? Document  YES/NO here: Yes   4. Advise patient to be prepared - "Two hours prior to your appointment, go ahead and check your blood pressure, pulse, oxygen saturation, and your weight (if you have the equipment to check those) and write them all down. When your visit starts, your provider will ask you for this information. If you have an Apple Watch or Kardia device, please plan to have heart rate information ready on the day of your appointment. Please have a pen and paper handy nearby the day of the visit as well."  5. Give patient instructions for MyChart download to smartphone OR Doximity/Doxy.me as below if video visit (depending on what platform provider is using)  6. Inform patient they will receive a phone call 15 minutes prior to their appointment time (may be from unknown caller ID) so they should be prepared to answer    TELEPHONE CALL NOTE  [name] has been deemed a candidate for a follow-up tele-health visit to limit community exposure during the Covid-19 pandemic. I spoke with the patient via phone to ensure availability of phone/video source, confirm preferred email & phone number, and discuss instructions and expectations.  I reminded [name] to be prepared with any vital sign and/or heart rhythm information that could potentially be obtained via home monitoring, at the time of his visit. I reminded [name] to expect a phone call prior  to his visit.  Bishop DublinSharon Dhyan Noah, CMA 11/27/2018 11:41 AM   INSTRUCTIONS FOR DOWNLOADING THE MYCHART APP TO SMARTPHONE  - The patient must first make sure to have activated MyChart and know their login information - If Apple, go to [employer] Store and type in MyChart in the search bar and download the app. If Android, ask patient to go to [employer] Play Store and type in [place] [facility]My[facility] in the search bar and download the app. The app is free but as with any other app downloads, their phone may require them to verify saved payment information or  Apple/Android password.  - The patient will need to then log into the app with their MyChart username and password, and select [facility] as their healthcare provider to link the account. When it is time for your visit, go to the MyChart app, find appointments, and click Begin Video Visit. Be sure to Select Allow for your device to access the Microphone and Camera for your visit. You will then be connected, and your provider will be with you shortly.  **If they have any issues connecting, or need assistance please contact MyChart service desk [phone])**  **If using a computer, in order to ensure the best quality for their visit they will need to use either of the following Internet Browsers: [employer] Edge, or Google Chrome**  IF USING DOXIMITY or DOXY.ME - The patient will receive a link just prior to their visit by text.     FULL LENGTH CONSENT FOR TELE-HEALTH VISIT   I hereby voluntarily request, consent and authorize [facility] and its employed or contracted physicians, physician assistants, nurse practitioners or other licensed health care professionals (the Practitioner), to provide me with telemedicine health care services (the "Services") as deemed necessary by the treating Practitioner. I acknowledge and consent to receive the Services by the Practitioner via telemedicine. I understand that the telemedicine visit will involve communicating with the Practitioner through live audiovisual communication technology and the disclosure of certain medical information by electronic transmission. I acknowledge that I have been given the opportunity to request an in-person assessment or other available alternative prior to the telemedicine visit and am voluntarily participating in the telemedicine visit.  I understand that I have the right to withhold or withdraw my consent to the use of telemedicine in the course of my care at any time, without affecting my right to future care  or treatment, and that the Practitioner or I may terminate the telemedicine visit at any time. I understand that I have the right to inspect all information obtained and/or recorded in the course of the telemedicine visit and may receive copies of available information for a reasonable fee.  I understand that some of the potential risks of receiving the Services via telemedicine include:  Marland Kitchen. Delay or interruption in medical evaluation due to technological equipment failure or disruption; . Information transmitted may not be sufficient (e.g. poor resolution of images) to allow for appropriate medical decision making by the Practitioner; and/or  . In rare instances, security protocols could fail, causing a breach of personal health information.  Furthermore, I acknowledge that it is my responsibility to provide information about my medical history, conditions and care that is complete and accurate to the best of my ability. I acknowledge that Practitioner's advice, recommendations, and/or decision may be based on factors not within their control, such as incomplete or inaccurate data provided by me or distortions of diagnostic images or specimens that may result from electronic transmissions.  I understand that the practice of medicine is not an exact science and that Practitioner makes no warranties or guarantees regarding treatment outcomes. I acknowledge that I will receive a copy of this consent concurrently upon execution via email to the email address I last provided but may also request a printed copy by calling the office of [facility].    I understand that my insurance will be billed for this visit.   I have read or had this consent read to me. . I understand the contents of this consent, which adequately explains the benefits and risks of the Services being provided via telemedicine.  . I have been provided ample opportunity to ask questions regarding this consent and the Services and have had  my questions answered to my satisfaction. . I give my informed consent for the services to be provided through the use of telemedicine in my medical care  By participating in this telemedicine visit I agree to the above.

## 2018-11-27 NOTE — Addendum Note (Signed)
Addended by: Lamar Laundry on: 11/27/2018 01:16 PM   Modules accepted: Orders

## 2018-12-01 ENCOUNTER — Telehealth: Payer: Self-pay | Admitting: Cardiovascular Disease

## 2018-12-01 NOTE — Telephone Encounter (Signed)
Recieved request from : [facility] Scanned to roi  Forwarded to [employer] for processing

## 2018-12-01 NOTE — Telephone Encounter (Signed)
lmov for patient update

## 2018-12-01 NOTE — Telephone Encounter (Signed)
Spoke with verne at [employer] .  She will send Release packet to patient via email @ [email or web address]

## 2018-12-05 NOTE — Telephone Encounter (Signed)
Patient is returning your call.  

## 2018-12-10 NOTE — Telephone Encounter (Signed)
Packet placed in nurse box for MD signature

## 2018-12-11 ENCOUNTER — Telehealth: Payer: Self-pay

## 2018-12-11 NOTE — Telephone Encounter (Signed)
FMLA form received from the nurse inbox and placed in Dr.Arida's inbox to be signed.

## 2018-12-16 ENCOUNTER — Telehealth: Payer: BLUE CROSS/BLUE SHIELD | Admitting: Cardiovascular Disease

## 2018-12-19 ENCOUNTER — Other Ambulatory Visit: Payer: Self-pay

## 2018-12-19 ENCOUNTER — Ambulatory Visit (INDEPENDENT_AMBULATORY_CARE_PROVIDER_SITE_OTHER): Payer: Managed Care, Other (non HMO)

## 2018-12-19 DIAGNOSIS — I359 Nonrheumatic aortic valve disorder, unspecified: Secondary | ICD-10-CM

## 2018-12-19 NOTE — Telephone Encounter (Signed)
Late entry: 7/30 form signed by Dr.Arida and given to Ashley @ the front desk. 

## 2018-12-19 NOTE — Telephone Encounter (Signed)
Received completed forms from physician.sent to [employer] via inter office 

## 2019-01-02 ENCOUNTER — Other Ambulatory Visit: Payer: Self-pay | Admitting: Cardiovascular Disease

## 2019-01-23 ENCOUNTER — Other Ambulatory Visit: Payer: Self-pay | Admitting: Cardiovascular Disease

## 2019-02-10 ENCOUNTER — Other Ambulatory Visit: Payer: Self-pay | Admitting: Cardiovascular Disease

## 2019-03-03 ENCOUNTER — Other Ambulatory Visit: Payer: Self-pay | Admitting: Cardiovascular Disease

## 2019-04-27 ENCOUNTER — Other Ambulatory Visit: Payer: Self-pay | Admitting: *Deleted

## 2019-04-27 MED ORDER — ATORVASTATIN CALCIUM 10 MG PO TABS: ORAL_TABLET | ORAL | 0 refills | Status: DC

## 2019-04-27 MED ORDER — LISINOPRIL 5 MG PO TABS: ORAL_TABLET | ORAL | 0 refills | Status: DC

## 2019-04-27 MED ORDER — METOPROLOL TARTRATE 25 MG PO TABS: 25.0000 mg | ORAL_TABLET | Freq: Two times a day (BID) | ORAL | 0 refills | Status: DC

## 2019-08-21 NOTE — Progress Notes (Signed)
Cardiology Office Note    Date:  08/24/2019   ID:  [name], DOB Dec 15, 1985, MRN [id number]  PCP:  Darreld Mclean, MD  Cardiologist:  Kathlyn Sacramento, MD  Electrophysiologist:  None   Chief Complaint: Follow-up  History of Present Illness:   [name] is a 34 y.o. male with history of CAD with anterior STEMI in 05/2013 as detailed below, HFrEF secondary to ICM, bicuspid aortic valve and HLD who presents for follow-up of his CAD, cardiomyopathy, and bicuspid aortic valve.  He was admitted to the hospital in 05/2013 with an anterior STEMI.  Cardiac cath showed one-vessel CAD with an occluded mid LAD thought to be secondary to plaque rupture.  No other disease was noted.  He underwent successful aspiration thrombectomy and DES x2 to the mid LAD.  A second stent was placed due to suspected edge dissection versus spasm.  2D echo at that time showed an EF of 40 to 45%, akinesis of the mid distal anteroseptal and apical myocardium with grade 1 diastolic dysfunction and bicuspid aortic valve without significant regurgitation or stenosis.  UDS was positive for amphetamines.  He reported taking energy pills from a friend.  Follow-up echo from 05/2015 showed normalization of his LV systolic function with an EF of 55 to 60%, mid anteroseptal to apical septal hypokinesis, normal LV diastolic function, bicuspid aortic valve with no evidence of stenosis or regurgitation, aortic root 29 mm, ascending aorta 30 mm, normal RV systolic function and cavity size.  He was most recently seen virtually on 11/27/2018 and was doing well from a cardiac perspective.  Follow-up echo from 12/19/2018, showed low normal EF of 50 to 55%, normal LV diastolic function, mid anteroseptal to apical septal and apical hypokinesis, normal RV systolic function and cavity size, aortic valve poorly visualized, normal size and structure aortic root and ascending aorta.  He comes in doing well from a cardiac perspective.  No chest  pain, dyspnea, palpitations, dizziness, presyncope, syncope, lower extremity swelling, PND, orthopnea, or early satiety.  No falls, hematochezia, or melena.  He is tolerating all medications without issues.  He plans on getting his first Covid vaccine later this week.  He does not have any issues or concerns at this time.   Labs independently reviewed: 11/2016 - albumin 4.9, AST normal, ALT 59, TC 135, TG 94, HDL 56, LDL 60 07/2015 - TSH normal, Hgb 15.9, PLT 235 06/2015 - potassium 4.1, BUN 11, serum creatinine 0.89  Past Medical History:  Diagnosis Date  . Coronary artery disease   . Hidradenitis   . History of Doppler ultrasound    a. Carotid US 3/17: normal carotid arteries bilaterally  . Hyperlipidemia   . Ischemic cardiomyopathy    Ejection fraction of 40-45% due to anterior myocardial infarction    Past Surgical History:  Procedure Laterality Date  . ANKLE SURGERY Left   . CARDIAC CATHETERIZATION    . CORONARY ANGIOPLASTY    . LEFT HEART CATHETERIZATION WITH CORONARY ANGIOGRAM N/A 06/14/2013   Procedure: LEFT HEART CATHETERIZATION WITH CORONARY ANGIOGRAM;  Surgeon: Wellington Hampshire, MD;  Location: [facility] CATH LAB;  Service: Cardiovascular;  Laterality: N/A;  . PERCUTANEOUS CORONARY STENT INTERVENTION (PCI-S)  06/14/2013   Procedure: PERCUTANEOUS CORONARY STENT INTERVENTION (PCI-S);  Surgeon: Wellington Hampshire, MD;  Location: [facility] CATH LAB;  Service: Cardiovascular;;  mid LAD 2DES placed  . PILONIDAL CYST EXCISION    . WISDOM TOOTH EXTRACTION      Current Medications: Current Meds  Medication Sig  . aspirin EC 81 MG EC tablet Take 1 tablet (81 mg total) by mouth daily.  Marland Kitchen atorvastatin (LIPITOR) 10 MG tablet TAKE 1 TABLET(10 MG) BY MOUTH DAILY AT 6 PM  . lisinopril (ZESTRIL) 5 MG tablet TAKE 1 TABLET(5 MG) BY MOUTH DAILY  . metoprolol tartrate (LOPRESSOR) 25 MG tablet Take 1 tablet (25 mg total) by mouth 2 (two) times daily.  . Multiple Vitamin (MULTI-VITAMINS) TABS Take 1 tablet by  mouth daily.  . nitroGLYCERIN (NITROSTAT) 0.4 MG SL tablet DISSOLVE 1 TABLET UNDER THE TONGUE EVERY 5 MINUTES AS NEEDED FOR CHEST PAIN    Allergies:   Penicillins   Social History   Socioeconomic History  . Marital status: Single    Spouse name: Not on file  . Number of children: Not on file  . Years of education: Not on file  . Highest education level: Not on file  Occupational History  . Not on file  Tobacco Use  . Smoking status: Never Smoker  . Smokeless tobacco: Never Used  Substance and Sexual Activity  . Alcohol use: Yes    Alcohol/week: 0.0 standard drinks    Comment: once a week  . Drug use: No  . Sexual activity: Yes  Other Topics Concern  . Not on file  Social History Narrative   He lives alone.  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   Education:  B.S.    Social Determinants of Health   Financial Resource Strain:   . Difficulty of Paying Living Expenses:   Food Insecurity:   . Worried About Programme researcher, broadcasting/film/video in the Last Year:   . Barista in the Last Year:   Transportation Needs:   . Freight forwarder (Medical):   Marland Kitchen Lack of Transportation (Non-Medical):   Physical Activity:   . Days of Exercise per Week:   . Minutes of Exercise per Session:   Stress:   . Feeling of Stress :   Social Connections:   . Frequency of Communication with Friends and Family:   . Frequency of Social Gatherings with Friends and Family:   . Attends Religious Services:   . Active Member of Clubs or Organizations:   . Attends Banker Meetings:   Marland Kitchen Marital Status:      Family History:  The patient's family history includes Coronary artery disease in his paternal grandmother; Diabetes in his paternal grandmother; Headache in his father; Healthy in his brother and mother.  ROS:   Review of Systems  Constitutional: Negative for chills, diaphoresis, fever, malaise/fatigue and weight loss.  HENT: Negative for congestion.   Eyes: Negative for  discharge and redness.  Respiratory: Negative for cough, sputum production, shortness of breath and wheezing.   Cardiovascular: Negative for chest pain, palpitations, orthopnea, claudication, leg swelling and PND.  Gastrointestinal: Negative for abdominal pain, blood in stool, heartburn, melena, nausea and vomiting.  Musculoskeletal: Negative for falls and myalgias.  Skin: Negative for rash.  Neurological: Negative for dizziness, tingling, tremors, sensory change, speech change, focal weakness, loss of consciousness and weakness.  Endo/Heme/Allergies: Does not bruise/bleed easily.  Psychiatric/Behavioral: Negative for substance abuse. The patient is not nervous/anxious.   All other systems reviewed and are negative.    EKGs/Labs/Other Studies Reviewed:    Studies reviewed were summarized above. The additional studies were reviewed today:  2D echo 11/2018: 1. The left ventricle has low normal systolic function, with an ejection  fraction of 50-55%. The  cavity size was normal. Left ventricular diastolic  parameters were normal.  Mid anteroseptal to apical septal, apical hypokinesis.  2. The right ventricle has normal systolic function. The cavity was  normal. There is no increase in right ventricular wall thickness. Unable  to estimate RVSP. 3. The aortic valve was not well visualized.Unable to exclude bicuspid  valve.  4. The aorta is normal in size and structure. Root is 3.3 cm, ascending  aorta 2.8 cm   EKG:  EKG is ordered today.  The EKG ordered today demonstrates NSR with sinus arrhythmia, 68 bpm, no acute ST-T changes, no significant change compared to prior studies  Recent Labs: No results found for requested labs within last 8760 hours.  Recent Lipid Panel    Component Value Date/Time   CHOL 135 12/04/2016 0000   TRIG 94 12/04/2016 0000   HDL 56 12/04/2016 0000   CHOLHDL 2.4 12/04/2016 0000   CHOLHDL 3 12/14/2014 1129   VLDL 31.6 12/14/2014 1129   LDLCALC 60  12/04/2016 0000    PHYSICAL EXAM:    VS:  BP 100/70 (BP Location: Left Arm, Patient Position: Sitting, Cuff Size: Normal)   Pulse 68   Ht 5\' 6"  (1.676 m)   Wt 179 lb 2 oz (81.3 kg)   SpO2 98%   BMI 28.91 kg/m   BMI: Body mass index is 28.91 kg/m.  Physical Exam  Constitutional: He is oriented to person, place, and time. He appears well-developed and well-nourished.  HENT:  Head: Normocephalic and atraumatic.  Eyes: Right eye exhibits no discharge. Left eye exhibits no discharge.  Neck: No JVD present.  Cardiovascular: Normal rate, regular rhythm, S1 normal, S2 normal and normal heart sounds. Exam reveals no distant heart sounds, no friction rub, no midsystolic click and no opening snap.  No murmur heard. Pulses:      Posterior tibial pulses are 2+ on the right side and 2+ on the left side.  Pulmonary/Chest: Effort normal and breath sounds normal. No respiratory distress. He has no decreased breath sounds. He has no wheezes. He has no rales. He exhibits no tenderness.  Abdominal: Soft. He exhibits no distension. There is no abdominal tenderness.  Musculoskeletal:        General: No edema.     Cervical back: Normal range of motion.  Neurological: He is alert and oriented to person, place, and time.  Skin: Skin is warm and dry. No cyanosis. Nails show no clubbing.  Psychiatric: He has a normal mood and affect. His speech is normal and behavior is normal. Judgment and thought content normal.    Wt Readings from Last 3 Encounters:  08/24/19 179 lb 2 oz (81.3 kg)  11/27/18 159 lb (72.1 kg)  12/04/16 169 lb (76.7 kg)     ASSESSMENT & PLAN:   1. CAD involving the native coronary arteries without angina: He is doing well without any symptoms concerning for angina.  Continue secondary prevention with aspirin, atorvastatin, lisinopril, metoprolol, and as needed sublingual nitro.  Continue treatment of risk factors.  No plans for ischemic evaluation at this time.  2. Bicuspid aortic  valve: No murmur on exam.  No evidence of stenosis or regurgitation on imaging.  Aortic root and ascending aorta without significant dilatation on imaging.  Follow-up echo in 11/2019.  3. HFrEF secondary to ICM: Most recent echo demonstrated improved, now low normal LV systolic function with an EF of 55% with mild distal anterior and apical hypokinesis.  Relative, asymptomatic hypotension precludes titration/escalation of  GDMT.  Continue current doses of lisinopril and metoprolol tartrate.  4. Hyperlipidemia: LDL of 60 from 11/2016.  He remains on atorvastatin 10 mg daily.  Check CMP, lipid panel, and direct LDL with further recommendations regarding medical therapy pending these results.   Disposition: F/u with Dr. Kirke Corin or an APP in 12 months, sooner if needed.   Medication Adjustments/Labs and Tests Ordered: Current medicines are reviewed at length with the patient today.  Concerns regarding medicines are outlined above. Medication changes, Labs and Tests ordered today are summarized above and listed in the Patient Instructions accessible in Encounters.   Signed, Eula Listen, PA-C 08/24/2019 2:38 PM     [facility] [address] [place], [place] [address] [phone]

## 2019-08-24 ENCOUNTER — Encounter: Payer: Self-pay | Admitting: Physician Assistant

## 2019-08-24 ENCOUNTER — Ambulatory Visit (INDEPENDENT_AMBULATORY_CARE_PROVIDER_SITE_OTHER): Payer: BC Managed Care – PPO | Admitting: Physician Assistant

## 2019-08-24 ENCOUNTER — Other Ambulatory Visit: Payer: Self-pay

## 2019-08-24 VITALS — BP 100/70 | HR 68 | Ht 66.0 in | Wt 179.1 lb

## 2019-08-24 DIAGNOSIS — Q231 Congenital insufficiency of aortic valve: Secondary | ICD-10-CM | POA: Diagnosis not present

## 2019-08-24 DIAGNOSIS — I5020 Unspecified systolic (congestive) heart failure: Secondary | ICD-10-CM

## 2019-08-24 DIAGNOSIS — E785 Hyperlipidemia, unspecified: Secondary | ICD-10-CM

## 2019-08-24 DIAGNOSIS — I2510 Atherosclerotic heart disease of native coronary artery without angina pectoris: Secondary | ICD-10-CM | POA: Diagnosis not present

## 2019-08-24 DIAGNOSIS — I255 Ischemic cardiomyopathy: Secondary | ICD-10-CM | POA: Diagnosis not present

## 2019-08-24 NOTE — Patient Instructions (Signed)
Medication Instructions:   1. Your physician recommends that you continue on your current medications as directed. Please refer to the Current Medication list given to you today.  *If you need a refill on your cardiac medications before your next appointment, please call your pharmacy*   Lab Work:  1. Your physician recommends that you have lab work today(CMET, LIPID, DIRECT LCL, CBC)  If you have labs (blood work) drawn today and your tests are completely normal, you will receive your results only by: Marland Kitchen MyChart Message (if you have MyChart) OR . A paper copy in the mail If you have any lab test that is abnormal or we need to change your treatment, we will call you to review the results.   Testing/Procedures:  1. Echocardiogram -- JULY  Please return to [facility] on ______________ at _______________ AM/PM for an Echocardiogram. Your physician has requested that you have an echocardiogram. Echocardiography is a painless test that uses sound waves to create images of your heart. It provides your doctor with information about the size and shape of your heart and how well your heart's chambers and valves are working. This procedure takes approximately one hour. There are no restrictions for this procedure. Please note; depending on visual quality an IV may need to be placed.    Follow-Up: At [facility], you and your health needs are our priority.  As part of our continuing mission to provide you with exceptional heart care, we have created designated Provider Care Teams.  These Care Teams include your primary Cardiologist (physician) and Advanced Practice Providers (APPs -  Physician Assistants and Nurse Practitioners) who all work together to provide you with the care you need, when you need it.  We recommend signing up for the patient portal called "MyChart".  Sign up information is provided on this After Visit Summary.  MyChart is used to connect with patients for  Virtual Visits (Telemedicine).  Patients are able to view lab/test results, encounter notes, upcoming appointments, etc.  Non-urgent messages can be sent to your provider as well.   To learn more about what you can do with MyChart, go to [email or web address].    Your next appointment:   12 month(s)  The format for your next appointment:   In Person  Provider:    You may see Lorine Bears, MD or one of the following Advanced Practice Providers on your designated Care Team:    Nicolasa Ducking, NP  Eula Listen, PA-C  Marisue Ivan, PA-C

## 2019-08-25 ENCOUNTER — Telehealth: Payer: Self-pay

## 2019-08-25 DIAGNOSIS — I2510 Atherosclerotic heart disease of native coronary artery without angina pectoris: Secondary | ICD-10-CM

## 2019-08-25 LAB — COMPREHENSIVE METABOLIC PANEL
ALT: 47 IU/L — ABNORMAL HIGH (ref 0–44)
AST: 29 IU/L (ref 0–40)
Albumin/Globulin Ratio: 2.2 (ref 1.2–2.2)
Albumin: 5.1 g/dL — ABNORMAL HIGH (ref 4.0–5.0)
Alkaline Phosphatase: 73 IU/L (ref 39–117)
BUN/Creatinine Ratio: 26 — ABNORMAL HIGH (ref 9–20)
BUN: 19 mg/dL (ref 6–20)
Bilirubin Total: 0.9 mg/dL (ref 0.0–1.2)
CO2: 20 mmol/L (ref 20–29)
Calcium: 10.1 mg/dL (ref 8.7–10.2)
Chloride: 102 mmol/L (ref 96–106)
Creatinine, Ser: 0.74 mg/dL — ABNORMAL LOW (ref 0.76–1.27)
GFR calc Af Amer: 137 mL/min/{1.73_m2} (ref >59)
GFR calc non Af Amer: 119 mL/min/{1.73_m2} (ref >59)
Globulin, Total: 2.3 g/dL (ref 1.5–4.5)
Glucose: 123 mg/dL — ABNORMAL HIGH (ref 65–99)
Potassium: 4.5 mmol/L (ref 3.5–5.2)
Sodium: 137 mmol/L (ref 134–144)
Total Protein: 7.4 g/dL (ref 6.0–8.5)

## 2019-08-25 LAB — CBC
Hematocrit: 48.9 % (ref 37.5–51.0)
Hemoglobin: 16.7 g/dL (ref 13.0–17.7)
MCH: 29.4 pg (ref 26.6–33.0)
MCHC: 34.2 g/dL (ref 31.5–35.7)
MCV: 86 fL (ref 79–97)
Platelets: 291 10*3/uL (ref 150–450)
RBC: 5.68 x10E6/uL (ref 4.14–5.80)
RDW: 13.3 % (ref 11.6–15.4)
WBC: 8.7 10*3/uL (ref 3.4–10.8)

## 2019-08-25 LAB — LIPID PANEL
Chol/HDL Ratio: 2.9 ratio (ref 0.0–5.0)
Cholesterol, Total: 180 mg/dL (ref 100–199)
HDL: 62 mg/dL (ref >39)
LDL Chol Calc (NIH): 78 mg/dL (ref 0–99)
Triglycerides: 243 mg/dL — ABNORMAL HIGH (ref 0–149)
VLDL Cholesterol Cal: 40 mg/dL (ref 5–40)

## 2019-08-25 LAB — LDL CHOLESTEROL, DIRECT: LDL Direct: 95 mg/dL (ref 0–99)

## 2019-08-25 MED ORDER — ATORVASTATIN CALCIUM 20 MG PO TABS: 20.0000 mg | ORAL_TABLET | Freq: Every day | ORAL | 3 refills | Status: DC

## 2019-08-25 NOTE — Telephone Encounter (Signed)
-----   Message from Sondra Barges, PA-C sent at 08/25/2019  7:21 AM EDT ----- Blood count normal. Random glucose okay. Kidney function normal. 1 LFT is mildly elevated though improved from prior and nonspecific. Potassium at goal. Direct LDL of 95 with goal being less than 70.  Recommendations: -Increase atorvastatin to 20 mg daily -Follow-up fasting lipid panel and liver function in 8 weeks

## 2019-08-25 NOTE — Telephone Encounter (Signed)
Call to patient to review labs.    Pt verbalized understanding and has no further questions at this time.    Advised pt to call for any further questions or concerns.  Orders updated.   I attempted to confirm u/s appt for 4/8. Pt reports he thought it was supposed to be for July. Routing to scheduling for change.

## 2019-08-27 ENCOUNTER — Other Ambulatory Visit: Payer: BC Managed Care – PPO

## 2019-08-27 DIAGNOSIS — Z23 Encounter for immunization: Secondary | ICD-10-CM | POA: Diagnosis not present

## 2019-09-21 ENCOUNTER — Other Ambulatory Visit: Payer: Self-pay | Admitting: Cardiovascular Disease

## 2019-12-17 ENCOUNTER — Ambulatory Visit (INDEPENDENT_AMBULATORY_CARE_PROVIDER_SITE_OTHER): Payer: BLUE CROSS/BLUE SHIELD

## 2019-12-17 ENCOUNTER — Other Ambulatory Visit: Payer: Self-pay

## 2019-12-17 DIAGNOSIS — I2510 Atherosclerotic heart disease of native coronary artery without angina pectoris: Secondary | ICD-10-CM | POA: Diagnosis not present

## 2019-12-18 LAB — ECHOCARDIOGRAM COMPLETE
AR max vel: 2.74 cm2
AV Area VTI: 2.95 cm2
AV Area mean vel: 2.52 cm2
AV Mean grad: 3 mmHg
AV Peak grad: 5.4 mmHg
Ao pk vel: 1.16 m/s
Area-P 1/2: 4.77 cm2
Calc EF: 37.7 %
S' Lateral: 3.6 cm
Single Plane A2C EF: 38.8 %
Single Plane A4C EF: 38.4 %

## 2019-12-21 ENCOUNTER — Telehealth: Payer: Self-pay

## 2019-12-21 NOTE — Telephone Encounter (Signed)
-----   Message from Sondra Barges, PA-C sent at 12/20/2019  8:04 AM EDT ----- Echo showed mildly reduced pump function, sluggish movement along the frotn portion of the heart (which was previously seen, and consistent with his prior MI), normal relaxation of the heart, no evidence of aortic stenosis or regurgitation, and normal size aorta.   When compared to echo from 11/2018, the pump function is a little lower.   Recommendations: -Stop Lopressor -Start Coreg 3.125 mg bid (lower dose given larger BP drop with Coreg and relative hypotension noted at his last visit) -Please schedule him to see Dr. Kirke Corin or myself in the next week or two for follow up of the above echo results and further optimization of medical therapy

## 2019-12-21 NOTE — Telephone Encounter (Signed)
Attempted to call patient. LMTCB 12/21/2019   

## 2019-12-28 NOTE — Telephone Encounter (Signed)
Attempted to call patient. LMTCB 12/28/2019   

## 2020-01-05 NOTE — Telephone Encounter (Signed)
3rd attempt. Attempted to call patient. [facility] 01/05/2020  Letter sent to patient.   [address]    [place] [address]   Dear [name],   ?   This letter is to inform you to call the office for multiple missed calls to discuss results. We have made 3 attempts to reach you by phone @ [phone] and left multiple messages.   ?   Please return call to clinic so we can discuss your plan of care. If you would like to update your account with any new numbers you can do so at that time.    ?   Thank you for allowing [place] to care for you.   ?   ?   Sincerely,   ?   Jace Dowe RN

## 2020-07-08 ENCOUNTER — Other Ambulatory Visit: Payer: Self-pay

## 2020-07-08 MED ORDER — METOPROLOL TARTRATE 25 MG PO TABS: 25.0000 mg | ORAL_TABLET | Freq: Two times a day (BID) | ORAL | 0 refills | Status: DC

## 2020-10-10 DIAGNOSIS — N486 Induration penis plastica: Secondary | ICD-10-CM | POA: Diagnosis not present

## 2020-10-10 DIAGNOSIS — N4889 Other specified disorders of penis: Secondary | ICD-10-CM | POA: Diagnosis not present

## 2020-12-20 ENCOUNTER — Other Ambulatory Visit: Payer: Self-pay | Admitting: Cardiovascular Disease

## 2020-12-21 ENCOUNTER — Other Ambulatory Visit: Payer: Self-pay

## 2020-12-21 MED ORDER — LISINOPRIL 5 MG PO TABS: 5.0000 mg | ORAL_TABLET | Freq: Every day | ORAL | 0 refills | Status: DC

## 2020-12-21 NOTE — Telephone Encounter (Signed)
Please schedule overdue F/U appointment for refills. Thank you! 

## 2020-12-22 ENCOUNTER — Other Ambulatory Visit: Payer: Self-pay | Admitting: *Deleted

## 2020-12-22 NOTE — Telephone Encounter (Signed)
VM Full 

## 2020-12-30 ENCOUNTER — Other Ambulatory Visit: Payer: Self-pay | Admitting: Cardiovascular Disease

## 2020-12-30 NOTE — Telephone Encounter (Signed)
*  STAT* If patient is at the pharmacy, call can be transferred to refill team.   1. Which medications need to be refilled? (please list name of each medication and dose if known)  Lisinopril 5 MG 1 tablet daily Metoprolol tartrate 25 MG 1 tablet 2 times daily   2. Which pharmacy/location (including street and city if local pharmacy) is medication to be sent to? [facility]   3. Do they need a 30 day or 90 day supply? 90 day   Patient scheduled 09/23

## 2021-01-02 ENCOUNTER — Other Ambulatory Visit: Payer: Self-pay | Admitting: *Deleted

## 2021-01-02 MED ORDER — LISINOPRIL 5 MG PO TABS: 5.0000 mg | ORAL_TABLET | Freq: Every day | ORAL | 1 refills | Status: DC

## 2021-01-02 MED ORDER — METOPROLOL TARTRATE 25 MG PO TABS: 25.0000 mg | ORAL_TABLET | Freq: Two times a day (BID) | ORAL | 1 refills | Status: DC

## 2021-01-02 NOTE — Telephone Encounter (Signed)
Lmovm to notify pt to keep future appointment for refills.

## 2021-01-02 NOTE — Telephone Encounter (Signed)
Lmovm to verify correct [employer].

## 2021-01-02 NOTE — Telephone Encounter (Signed)
Refill sent to pt local pharmacy per pt request.

## 2021-01-02 NOTE — Telephone Encounter (Signed)
Please advise per Pilar pt returned call concerning no refills at this time until appointment. Pt mentioned that he doesn't have enough pills to last him until his upcoming appointment. Please advise if ok to send in 30 day refill.

## 2021-01-02 NOTE — Telephone Encounter (Signed)
Please advise if ok to refill pt's medication request for 90 day. Pt overdue for 12 month f/u last seen 08/2019. Pt has scheduled future appointment.

## 2021-01-02 NOTE — Telephone Encounter (Signed)
Lmovm to verify correct [employer] to send 30 day refill.

## 2021-01-02 NOTE — Telephone Encounter (Signed)
Requested Prescriptions   Signed Prescriptions Disp Refills   lisinopril (ZESTRIL) 5 MG tablet 30 tablet 1    Sig: Take 1 tablet (5 mg total) by mouth daily.    Authorizing Provider: Lorine Bears A    Ordering User: Iverson Alamin C   metoprolol tartrate (LOPRESSOR) 25 MG tablet 60 tablet 1    Sig: Take 1 tablet (25 mg total) by mouth 2 (two) times daily.    Authorizing Provider: Lorine Bears A    Ordering User: Kendrick Fries

## 2021-01-10 ENCOUNTER — Other Ambulatory Visit: Payer: Self-pay | Admitting: Cardiovascular Disease

## 2021-02-09 NOTE — Progress Notes (Signed)
Cardiology Office Note    Date:  02/10/2021   ID:  [name], DOB 1986/01/19, MRN [id number]  PCP:  Pearline Cables, MD  Cardiologist:  Lorine Bears, MD  Electrophysiologist:  None   Chief Complaint: Follow-up  History of Present Illness:   [name] is a 35 y.o. male with history of CAD with anterior STEMI in 05/2013 as detailed below, HFrEF secondary to ICM, bicuspid aortic valve and HLD who presents for follow-up of his CAD, cardiomyopathy, and bicuspid aortic valve.   He was admitted to the hospital in 05/2013 with an anterior STEMI.  Cardiac cath showed one-vessel CAD with an occluded mid LAD thought to be secondary to plaque rupture.  No other disease was noted.  He underwent successful aspiration thrombectomy and DES x2 to the mid LAD.  A second stent was placed due to suspected edge dissection versus spasm.  2D echo at that time showed an EF of 40 to 45%, akinesis of the mid distal anteroseptal and apical myocardium with grade 1 diastolic dysfunction and bicuspid aortic valve without significant regurgitation or stenosis.  UDS was positive for amphetamines.  He reported taking energy pills from a friend.  Follow-up echo from 05/2015 showed normalization of his LV systolic function with an EF of 55 to 60%, mid anteroseptal to apical septal hypokinesis, normal LV diastolic function, bicuspid aortic valve with no evidence of stenosis or regurgitation, aortic root 29 mm, ascending aorta 30 mm, normal RV systolic function and cavity size.  He was seen virtually on 11/27/2018 and was doing well from a cardiac perspective.  Follow-up echo from 12/19/2018, showed low normal EF of 50 to 55%, normal LV diastolic function, mid anteroseptal to apical septal and apical hypokinesis, normal RV systolic function and cavity size, aortic valve poorly visualized, normal size and structure aortic root and ascending aorta.  He was last seen in the office in 08/2019 and continued to do well from a  cardiac perspective.  He underwent repeat echo in 11/2019, given history of bicuspid aortic valve, which demonstrated an EF of 40 to 45%, hypokinesis of the mid to distal anterior and anteroseptal wall and apical region, normal LV diastolic function parameters, normal RV systolic function and ventricular cavity size, no evidence of aortic stenosis, normal size and structure aortic root, and an estimated right atrial pressure of 3 mmHg.  Given drop in LV systolic function, it was recommended Lopressor be transitioned to carvedilol.  However, this change did not occur.  He comes in doing well from a cardiac perspective.  No chest pain, dyspnea, palpitations, dizziness, presyncope, syncope, or lower extremity swelling.  Since we last have seen him in the office he has moved to the [facility] and had to travel approximately 4 hours for today's appointment.  For now, he prefers to keep his cardiology care within our group.   Labs independently reviewed: 08/2019 - BUN 19, serum creatinine 0.74, potassium 4.5, albumin 5.1, AST normal, ALT 47, direct LDL 95, TC 180, TG 243, HDL 62, Hgb 16.7, PLT 291 0/9381 - TSH normal  Past Medical History:  Diagnosis Date   Coronary artery disease    Hidradenitis    History of Doppler ultrasound    a. Carotid US 3/17: normal carotid arteries bilaterally   Hyperlipidemia    Ischemic cardiomyopathy    Ejection fraction of 40-45% due to anterior myocardial infarction    Past Surgical History:  Procedure Laterality Date   ANKLE SURGERY Left  CARDIAC CATHETERIZATION     CORONARY ANGIOPLASTY     LEFT HEART CATHETERIZATION WITH CORONARY ANGIOGRAM N/A 06/14/2013   Procedure: LEFT HEART CATHETERIZATION WITH CORONARY ANGIOGRAM;  Surgeon: Iran Ouch, MD;  Location: [facility] CATH LAB;  Service: Cardiovascular;  Laterality: N/A;   PERCUTANEOUS CORONARY STENT INTERVENTION (PCI-S)  06/14/2013   Procedure: PERCUTANEOUS CORONARY STENT INTERVENTION (PCI-S);  Surgeon:  Iran Ouch, MD;  Location: [facility] CATH LAB;  Service: Cardiovascular;;  mid LAD 2DES placed   PILONIDAL CYST EXCISION     WISDOM TOOTH EXTRACTION      Current Medications: Current Meds  Medication Sig   aspirin EC 81 MG EC tablet Take 1 tablet (81 mg total) by mouth daily.   atorvastatin (LIPITOR) 20 MG tablet Take 1 tablet (20 mg total) by mouth daily at 6 PM. TAKE 1 TABLET(10 MG) BY MOUTH DAILY AT 6 PM   carvedilol (COREG) 6.25 MG tablet Take 1 tablet (6.25 mg total) by mouth 2 (two) times daily.   lisinopril (ZESTRIL) 5 MG tablet Take 1 tablet (5 mg total) by mouth daily.   Multiple Vitamin (MULTI-VITAMINS) TABS Take 1 tablet by mouth daily.   nitroGLYCERIN (NITROSTAT) 0.4 MG SL tablet DISSOLVE 1 TABLET UNDER THE TONGUE EVERY 5 MINUTES AS NEEDED FOR CHEST PAIN   sildenafil (VIAGRA) 50 MG tablet Take by mouth.   [DISCONTINUED] lisinopril (ZESTRIL) 10 MG tablet Take by mouth.   [DISCONTINUED] metoprolol tartrate (LOPRESSOR) 25 MG tablet Take 1 tablet (25 mg total) by mouth 2 (two) times daily.    Allergies:   Penicillins   Social History   Socioeconomic History   Marital status: Single    Spouse name: Not on file   Number of children: Not on file   Years of education: Not on file   Highest education level: Not on file  Occupational History   Not on file  Tobacco Use   Smoking status: Never   Smokeless tobacco: Never  Vaping Use   Vaping Use: Never used  Substance and Sexual Activity   Alcohol use: Yes    Alcohol/week: 0.0 standard drinks    Comment: once a week   Drug use: No   Sexual activity: Yes  Other Topics Concern   Not on file  Social History Narrative   He lives alone.  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   Education:  B.S.    Social Determinants of Health   Financial Resource Strain: Not on file  Food Insecurity: Not on file  Transportation Needs: Not on file  Physical Activity: Not on file  Stress: Not on file  Social Connections: Not on  file     Family History:  The patient's family history includes Coronary artery disease in his paternal grandmother; Diabetes in his paternal grandmother; Headache in his father; Healthy in his brother and mother.  ROS:   Negative unless specified in the HPI.   EKGs/Labs/Other Studies Reviewed:    Studies reviewed were summarized above. The additional studies were reviewed today:  2D echo 11/2018: 1. The left ventricle has low normal systolic function, with an ejection  fraction of 50-55%. The cavity size was normal. Left ventricular diastolic  parameters were normal.  Mid anteroseptal to apical septal, apical hypokinesis.   2. The right ventricle has normal systolic function. The cavity was  normal. There is no increase in right ventricular wall thickness. Unable  to estimate RVSP.  3. The aortic valve was not well visualized.Unable to  exclude bicuspid  valve.   4. The aorta is normal in size and structure. Root is 3.3 cm, ascending  aorta 2.8 cm  __________  2D echo 11/2019: 1. Left ventricular ejection fraction, by estimation, is 40 to 45%. The  left ventricle has mildly decreased function. The left ventricle  demonstrates regional wall motion abnormalities (Hypokinesis of the mid to  distal anterior and anteroseptal wall,  apical region). Left ventricular diastolic parameters were normal. The  average left ventricular global longitudinal strain is -15.0 %.   2. Right ventricular systolic function is normal. The right ventricular  size is normal.   3. The mitral valve is normal in structure. No evidence of mitral valve  regurgitation. No evidence of mitral stenosis.   4. The aortic valve was not well visualized. Aortic valve regurgitation  is not visualized. No aortic stenosis is present.   5. The inferior vena cava is normal in size with greater than 50%  respiratory variability, suggesting right atrial pressure of 3 mmHg.    EKG:  EKG is ordered today.  The EKG ordered  today demonstrates NSR, 65 bpm, no acute ST-T changes  Recent Labs: No results found for requested labs within last 8760 hours.  Recent Lipid Panel    Component Value Date/Time   CHOL 180 08/24/2019 1503   TRIG 243 (H) 08/24/2019 1503   HDL 62 08/24/2019 1503   CHOLHDL 2.9 08/24/2019 1503   CHOLHDL 3 12/14/2014 1129   VLDL 31.6 12/14/2014 1129   LDLCALC 78 08/24/2019 1503   LDLDIRECT 95 08/24/2019 1503    PHYSICAL EXAM:    VS:  BP 110/80 (BP Location: Left Arm, Patient Position: Sitting, Cuff Size: Normal)   Pulse 65   Ht 5\' 6"  (1.676 m)   Wt 180 lb (81.6 kg)   SpO2 99%   BMI 29.05 kg/m   BMI: Body mass index is 29.05 kg/m.  Physical Exam Constitutional:      Appearance: He is well-developed.  HENT:     Head: Normocephalic and atraumatic.  Eyes:     General:        Right eye: No discharge.        Left eye: No discharge.  Neck:     Vascular: No JVD.  Cardiovascular:     Rate and Rhythm: Normal rate and regular rhythm.     Pulses:          Dorsalis pedis pulses are 2+ on the right side and 2+ on the left side.       Posterior tibial pulses are 2+ on the right side and 2+ on the left side.     Heart sounds: Normal heart sounds, S1 normal and S2 normal. Heart sounds not distant. No midsystolic click and no opening snap. No murmur heard.   No friction rub.  Pulmonary:     Effort: Pulmonary effort is normal. No respiratory distress.     Breath sounds: Normal breath sounds. No decreased breath sounds, wheezing or rales.  Chest:     Chest wall: No tenderness.  Abdominal:     General: There is no distension.     Palpations: Abdomen is soft.     Tenderness: There is no abdominal tenderness.  Musculoskeletal:     Cervical back: Normal range of motion.     Right lower leg: No edema.     Left lower leg: No edema.  Skin:    General: Skin is warm and dry.     Nails:  There is no clubbing.  Neurological:     Mental Status: He is alert and oriented to person, place, and  time.  Psychiatric:        Speech: Speech normal.        Behavior: Behavior normal.        Thought Content: Thought content normal.        Judgment: Judgment normal.    Wt Readings from Last 3 Encounters:  02/10/21 180 lb (81.6 kg)  08/24/19 179 lb 2 oz (81.3 kg)  11/27/18 159 lb (72.1 kg)     ASSESSMENT & PLAN:   CAD involving the native coronary arteries without angina: He is doing well without any symptoms concerning for angina.  Continue secondary prevention and aggressive risk factor modification including aspirin, atorvastatin, and lisinopril.  We are transitioning him from Lopressor to carvedilol as outlined below to optimize medical therapy given his cardiomyopathy.  No indication for further ischemic testing at this time.  Bicuspid aortic valve: No evidence of significant stenosis on echo in 11/2019 with normal size and structure of the aortic root at that time.  Schedule echo.  HFrEF secondary to ICM: Most recent echo from 11/2019 showed a drop in his EF to 40 to 45% with prior EF being low normal at 50 to 55%.  Following this finding, it was recommended Lopressor be transitioned to carvedilol to optimize GDMT.  However, this change did not occur.  Stop Lopressor.  Start carvedilol 6.25 mg twice daily.  For now, he will remain on lisinopril.  Schedule echo to evaluate his cardiomyopathy on carvedilol.  If his cardiomyopathy persists, as blood pressure allows, would consider transitioning lisinopril to [facility] following 36-hour washout along with a repeat echo in several months time following optimization of maximally tolerated GDMT.  HLD: LDL 95 in 08/2019 with a goal being less than 70.  Future orders were placed and given to the patient to check a CMP, lipid panel, and direct LDL.  He remains on atorvastatin.  Disposition: F/u with Dr. Kirke Corin or an APP in 12 months, sooner if needed based on echo results.   Medication Adjustments/Labs and Tests Ordered: Current medicines are  reviewed at length with the patient today.  Concerns regarding medicines are outlined above. Medication changes, Labs and Tests ordered today are summarized above and listed in the Patient Instructions accessible in Encounters.   Signed, Eula Listen, PA-C 02/10/2021 4:16 PM     [facility] [address] [place], [place] [address] [phone]

## 2021-02-10 ENCOUNTER — Other Ambulatory Visit: Payer: Self-pay | Admitting: *Deleted

## 2021-02-10 ENCOUNTER — Ambulatory Visit: Payer: BLUE CROSS/BLUE SHIELD | Admitting: Physician Assistant

## 2021-02-10 ENCOUNTER — Other Ambulatory Visit: Payer: Self-pay

## 2021-02-10 ENCOUNTER — Encounter: Payer: Self-pay | Admitting: Physician Assistant

## 2021-02-10 VITALS — BP 110/80 | HR 65 | Ht 66.0 in | Wt 180.0 lb

## 2021-02-10 DIAGNOSIS — Q231 Congenital insufficiency of aortic valve: Secondary | ICD-10-CM | POA: Diagnosis not present

## 2021-02-10 DIAGNOSIS — I255 Ischemic cardiomyopathy: Secondary | ICD-10-CM

## 2021-02-10 DIAGNOSIS — I5020 Unspecified systolic (congestive) heart failure: Secondary | ICD-10-CM | POA: Diagnosis not present

## 2021-02-10 DIAGNOSIS — Q2381 Bicuspid aortic valve: Secondary | ICD-10-CM

## 2021-02-10 DIAGNOSIS — E785 Hyperlipidemia, unspecified: Secondary | ICD-10-CM

## 2021-02-10 DIAGNOSIS — I2510 Atherosclerotic heart disease of native coronary artery without angina pectoris: Secondary | ICD-10-CM

## 2021-02-10 MED ORDER — CARVEDILOL 6.25 MG PO TABS: 6.2500 mg | ORAL_TABLET | Freq: Two times a day (BID) | ORAL | 3 refills | Status: DC

## 2021-02-10 NOTE — Patient Instructions (Addendum)
Medication Instructions:  Your physician has recommended you make the following change in your medication:   STOP Metoprolol  START Carvedilol 6.25 taking 1 tablet twice a day  *If you need a refill on your cardiac medications before your next appointment, please call your pharmacy*   Lab Work: You can get in the next week or 2:  MAKE SURE YOU ARE FASTING:  WE WILL GET LIPID, CMET, & DIRECT LDL .Marland Kitchen TAKE THE ORDERS WITH YOU  If you have labs (blood work) drawn today and your tests are completely normal, you will receive your results only by: MyChart Message (if you have MyChart) OR A paper copy in the mail If you have any lab test that is abnormal or we need to change your treatment, we will call you to review the results.   Testing/Procedures: Your physician has requested that you have an echocardiogram 1 MONTH. Echocardiography is a painless test that uses sound waves to create images of your heart. It provides your doctor with information about the size and shape of your heart and how well your heart's chambers and valves are working. This procedure takes approximately one hour. There are no restrictions for this procedure.    Follow-Up: At [facility], you and your health needs are our priority.  As part of our continuing mission to provide you with exceptional heart care, we have created designated Provider Care Teams.  These Care Teams include your primary Cardiologist (physician) and Advanced Practice Providers (APPs -  Physician Assistants and Nurse Practitioners) who all work together to provide you with the care you need, when you need it.  We recommend signing up for the patient portal called "MyChart".  Sign up information is provided on this After Visit Summary.  MyChart is used to connect with patients for Virtual Visits (Telemedicine).  Patients are able to view lab/test results, encounter notes, upcoming appointments, etc.  Non-urgent messages can be sent to your provider as  well.   To learn more about what you can do with MyChart, go to [email or web address].    Your next appointment:   12 month(s)  The format for your next appointment:   In Person  Provider:   You may see Lorine Bears, MD or one of the following Advanced Practice Providers on your designated Care Team:   Nicolasa Ducking, NP Eula Listen, PA-C Marisue Ivan, PA-C Cadence Fransico Michael, [place]   Other Instructions

## 2021-03-16 ENCOUNTER — Other Ambulatory Visit: Payer: BLUE CROSS/BLUE SHIELD

## 2021-04-09 ENCOUNTER — Other Ambulatory Visit: Payer: Self-pay | Admitting: Cardiovascular Disease

## 2021-04-25 ENCOUNTER — Ambulatory Visit (INDEPENDENT_AMBULATORY_CARE_PROVIDER_SITE_OTHER): Payer: BLUE CROSS/BLUE SHIELD

## 2021-04-25 ENCOUNTER — Other Ambulatory Visit: Payer: Self-pay

## 2021-04-25 DIAGNOSIS — I255 Ischemic cardiomyopathy: Secondary | ICD-10-CM

## 2021-04-25 LAB — ECHOCARDIOGRAM COMPLETE
AR max vel: 2.84 cm2
AV Area VTI: 2.71 cm2
AV Area mean vel: 2.67 cm2
AV Mean grad: 4 mmHg
AV Peak grad: 6.9 mmHg
Ao pk vel: 1.31 m/s
Area-P 1/2: 4.01 cm2
Calc EF: 49.7 %
S' Lateral: 3.9 cm
Single Plane A2C EF: 48.9 %
Single Plane A4C EF: 48.1 %

## 2021-04-27 ENCOUNTER — Telehealth: Payer: Self-pay | Admitting: *Deleted

## 2021-04-27 NOTE — Telephone Encounter (Signed)
-----   Message from Sondra Barges, PA-C sent at 04/26/2021  7:08 AM EST ----- Echo demonstrated a slightly reduced pump function with an EF of 40 to 45% with hypokinesis of the anterior, anteroseptal, and septal wall, slightly stiffened heart, mildly leaky mitral valve, and borderline dilatation of the aortic root.  The pump function remains mildly reduced, though is stable when compared to prior imaging.  Wall motion abnormality noted on echo is consistent with his prior known anterior STEMI and unchanged from prior study.  Borderline dilatation of the aortic root can be followed with echo next year.  If there is significant change in measurement we can consider cross-sectional imaging.  Recommendations: -Please schedule the patient to see me within the next several weeks for further discussion of optimization of evidence-based medical therapy as blood pressure allows

## 2021-04-27 NOTE — Telephone Encounter (Signed)
Left voicemail message to call back for review of results.  

## 2021-04-28 DIAGNOSIS — I255 Ischemic cardiomyopathy: Secondary | ICD-10-CM | POA: Diagnosis not present

## 2021-04-28 DIAGNOSIS — E785 Hyperlipidemia, unspecified: Secondary | ICD-10-CM | POA: Diagnosis not present

## 2021-04-28 DIAGNOSIS — Q231 Congenital insufficiency of aortic valve: Secondary | ICD-10-CM | POA: Diagnosis not present

## 2021-04-28 DIAGNOSIS — I2510 Atherosclerotic heart disease of native coronary artery without angina pectoris: Secondary | ICD-10-CM | POA: Diagnosis not present

## 2021-04-28 DIAGNOSIS — I5020 Unspecified systolic (congestive) heart failure: Secondary | ICD-10-CM | POA: Diagnosis not present

## 2021-04-29 LAB — COMPREHENSIVE METABOLIC PANEL
ALT: 59 IU/L — ABNORMAL HIGH (ref 0–44)
AST: 36 IU/L (ref 0–40)
Albumin/Globulin Ratio: 2.4 — ABNORMAL HIGH (ref 1.2–2.2)
Albumin: 5.2 g/dL — ABNORMAL HIGH (ref 4.0–5.0)
Alkaline Phosphatase: 60 IU/L (ref 44–121)
BUN/Creatinine Ratio: 12 (ref 9–20)
BUN: 10 mg/dL (ref 6–20)
Bilirubin Total: 1.9 mg/dL — ABNORMAL HIGH (ref 0.0–1.2)
CO2: 24 mmol/L (ref 20–29)
Calcium: 9.5 mg/dL (ref 8.7–10.2)
Chloride: 91 mmol/L — ABNORMAL LOW (ref 96–106)
Creatinine, Ser: 0.82 mg/dL (ref 0.76–1.27)
Globulin, Total: 2.2 g/dL (ref 1.5–4.5)
Glucose: 98 mg/dL (ref 70–99)
Potassium: 4.3 mmol/L (ref 3.5–5.2)
Sodium: 132 mmol/L — ABNORMAL LOW (ref 134–144)
Total Protein: 7.4 g/dL (ref 6.0–8.5)
eGFR: 116 mL/min/{1.73_m2} (ref >59)

## 2021-04-29 LAB — LIPID PANEL
Chol/HDL Ratio: 3.7 ratio (ref 0.0–5.0)
Cholesterol, Total: 213 mg/dL — ABNORMAL HIGH (ref 100–199)
HDL: 57 mg/dL (ref >39)
LDL Chol Calc (NIH): 112 mg/dL — ABNORMAL HIGH (ref 0–99)
Triglycerides: 256 mg/dL — ABNORMAL HIGH (ref 0–149)
VLDL Cholesterol Cal: 44 mg/dL — ABNORMAL HIGH (ref 5–40)

## 2021-04-29 LAB — LDL CHOLESTEROL, DIRECT: LDL Direct: 115 mg/dL — ABNORMAL HIGH (ref 0–99)

## 2021-05-01 NOTE — Telephone Encounter (Signed)
Left voicemail message to call back for review of results and recommendations.  

## 2021-05-01 NOTE — Telephone Encounter (Signed)
Left voicemail message to call back for review of results.  

## 2021-05-01 NOTE — Telephone Encounter (Signed)
Per provider appointment can be in January.      RE: appointment Received: 3 days ago Donald Siva  Bryna Colander, RN 1/24 is ok

## 2021-05-02 NOTE — Telephone Encounter (Signed)
Left voicemail message to call back for review of both lab and echo results.

## 2021-05-02 NOTE — Telephone Encounter (Signed)
-----   Message from Sondra Barges, PA-C sent at 04/30/2021  8:07 AM EST ----- Cholesterol/triglycerides are elevated, sodium is low, some liver function tests are abnormal   Recommendations: -Decrease sugary foods/drinks -If he has been drinking alcohol, recommend cessation as this can contribute to abnormalities noted on labs -Follow up with PCP for abnormal LFT

## 2021-05-04 NOTE — Telephone Encounter (Signed)
Left voicemail message for patient to call back. Patients father states he was not able to reach him as well so will have him call back when he reaches him.

## 2021-05-04 NOTE — Telephone Encounter (Signed)
Reviewed results and recommendations with patient. Scheduled him return appointment per APP. He verbalized understanding of our conversation with no further questions at this time.

## 2021-05-04 NOTE — Telephone Encounter (Signed)
Reviewed results and recommendations with patients father per release form. Advised I have called several times over the last few days but have not been able to reach his son or get a call back. He will reach out to his son and requested I call him at 10:30 am so that I can review with him as well. He verbalized understanding of our conversation, agreement with plan, and had no further questions at this time.

## 2021-05-04 NOTE — Telephone Encounter (Signed)
Patient returning call.

## 2021-06-14 NOTE — Progress Notes (Signed)
Cardiology Office Note    Date:  06/16/2021   ID:  [name], DOB December 26, 1985, MRN [id number]  PCP:  Pearline Cablesopland, Jessica C, MD  Cardiologist:  Lorine BearsMuhammad Arida, MD  Electrophysiologist:  None   Chief Complaint: Follow-up  History of Present Illness:   [name] is a 36 y.o. male with history of CAD with anterior STEMI in 05/2013 as detailed below, HFrEF secondary to ICM, bicuspid aortic valve and HLD who presents for follow-up of his CAD, cardiomyopathy, and bicuspid aortic valve.   He was admitted to the hospital in 05/2013 with an anterior STEMI.  Cardiac cath showed one-vessel CAD with an occluded mid LAD thought to be secondary to plaque rupture.  No other disease was noted.  He underwent successful aspiration thrombectomy and DES x2 to the mid LAD.  A second stent was placed due to suspected edge dissection versus spasm.  2D echo at that time showed an EF of 40 to 45%, akinesis of the mid distal anteroseptal and apical myocardium with grade 1 diastolic dysfunction and bicuspid aortic valve without significant regurgitation or stenosis.  UDS was positive for amphetamines.  He reported taking energy pills from a friend.  Follow-up echo from 05/2015 showed normalization of his LV systolic function with an EF of 55 to 60%, mid anteroseptal to apical septal hypokinesis, normal LV diastolic function, bicuspid aortic valve with no evidence of stenosis or regurgitation, aortic root 29 mm, ascending aorta 30 mm, normal RV systolic function and cavity size.  Follow-up echo 12/19/2018, showed low normal EF of 50 to 55%, normal LV diastolic function, mid anteroseptal to apical septal and apical hypokinesis, normal RV systolic function and cavity size, aortic valve poorly visualized, normal size and structure aortic root and ascending aorta.  He underwent repeat echo in 11/2019, given history of bicuspid aortic valve, which demonstrated an EF of 40 to 45%, hypokinesis of the mid to distal anterior and  anteroseptal wall and apical region, normal LV diastolic function parameters, normal RV systolic function and ventricular cavity size, no evidence of aortic stenosis, normal size and structure aortic root, and an estimated right atrial pressure of 3 mmHg.  He was last seen in the office in 01/2021 and was doing well from a cardiac perspective.  [place] [facility] [place]  He preferred to keep his care with our office.  He was transitioned from Lopressor to carvedilol.  Subsequent echo in 04/2021 demonstrated an EF of 40 to 45%, hypokinesis of the anterior, anteroseptal, and apical regions, grade 2 diastolic dysfunction, normal RV systolic function and ventricular cavity size, mild mitral regurgitation, poorly visualized aortic valve without evidence of stenosis, and borderline dilatation of the aortic root measuring 37 mm.  He comes in doing well from a cardiac perspective and is without symptoms concerning for angina or decompensation.  No dizziness, presyncope, or syncope.  No lower extremity swelling or orthopnea.  His weight remains stable.  He is working on improving his diet and remaining active.  He is watching his salt intake.  He is tolerating cardiac medications without issues.  No falls, hematochezia, or melena.   Labs independently reviewed: 04/2021 - direct LDL 115, TC 213, TG 256, HDL 57, BUN 10, serum creatinine 0.82, potassium 4.3, albumin 5.2, AST normal, ALT 59 08/2019 - Hgb 16.7, PLT 291 07/2015 - TSH normal  Past Medical History:  Diagnosis Date   Coronary artery disease    Hidradenitis    History of  Doppler ultrasound    a. Carotid US 3/17: normal carotid arteries bilaterally   Hyperlipidemia    Ischemic cardiomyopathy    Ejection fraction of 40-45% due to anterior myocardial infarction    Past Surgical History:  Procedure Laterality Date   ANKLE SURGERY Left    CARDIAC CATHETERIZATION     CORONARY ANGIOPLASTY     LEFT HEART CATHETERIZATION WITH  CORONARY ANGIOGRAM N/A 06/14/2013   Procedure: LEFT HEART CATHETERIZATION WITH CORONARY ANGIOGRAM;  Surgeon: Iran Ouch, MD;  Location: [facility] CATH LAB;  Service: Cardiovascular;  Laterality: N/A;   PERCUTANEOUS CORONARY STENT INTERVENTION (PCI-S)  06/14/2013   Procedure: PERCUTANEOUS CORONARY STENT INTERVENTION (PCI-S);  Surgeon: Iran Ouch, MD;  Location: [facility] C[facility] LAB;  Service: Cardiovascular;;  mid LAD 2DES placed   PILONIDAL CYST EXCISION     WISDOM TOOTH EXTRACTION      Current Medications: Current Meds  Medication Sig   aspirin EC 81 MG EC tablet Take 1 tablet (81 mg total) by mouth daily.   atorvastatin (LIPITOR) 20 MG tablet Take 1 tablet (20 mg total) by mouth daily at 6 PM. TAKE 1 TABLET(10 MG) BY MOUTH DAILY AT 6 PM   carvedilol (COREG) 6.25 MG tablet Take 1 tablet (6.25 mg total) by mouth 2 (two) times daily.   lisinopril (ZESTRIL) 5 MG tablet Take 1 tablet by mouth daily.   Multiple Vitamin (MULTI-VITAMINS) TABS Take 1 tablet by mouth daily.   nitroGLYCERIN (NITROSTAT) 0.4 MG SL tablet DISSOLVE 1 TABLET UNDER THE TONGUE EVERY 5 MINUTES AS NEEDED FOR CHEST PAIN   sildenafil (VIAGRA) 50 MG tablet Take 50 mg by mouth as needed.    Allergies:   Penicillins   Social History   Socioeconomic History   Marital status: Single    Spouse name: Not on file   Number of children: Not on file   Years of education: Not on file   Highest education level: Not on file  Occupational History   Not on file  Tobacco Use   Smoking status: Never   Smokeless tobacco: Never  Vaping Use   Vaping Use: Never used  Substance and Sexual Activity   Alcohol use: Yes    Alcohol/week: 0.0 standard drinks    Comment: once a week   Drug use: No   Sexual activity: Yes  Other Topics Concern   Not on file  Social History Narrative   He lives alone.  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   Education:  B.S.    Social Determinants of Health   Financial Resource Strain: Not on file   Food Insecurity: Not on file  Transportation Needs: Not on file  Physical Activity: Not on file  Stress: Not on file  Social Connections: Not on file     Family History:  The patient's family history includes Coronary artery disease in his paternal grandmother; Diabetes in his paternal grandmother; Headache in his father; Healthy in his brother and mother.  ROS:    12-point review of system is negative as otherwise noted in the HPI  EKGs/Labs/Other Studies Reviewed:    Studies reviewed were summarized above. The additional studies were reviewed today:  2D echo 11/2018: 1. The left ventricle has low normal systolic function, with an ejection  fraction of 50-55%. The cavity size was normal. Left ventricular diastolic  parameters were normal.  Mid anteroseptal to apical septal, apical hypokinesis.   2. The right ventricle has normal systolic function. The cavity was  normal. There is no increase in right ventricular wall thickness. Unable  to estimate RVSP.  3. The aortic valve was not well visualized.Unable to exclude bicuspid  valve.   4. The aorta is normal in size and structure. Root is 3.3 cm, ascending  aorta 2.8 cm  __________   2D echo 11/2019: 1. Left ventricular ejection fraction, by estimation, is 40 to 45%. The  left ventricle has mildly decreased function. The left ventricle  demonstrates regional wall motion abnormalities (Hypokinesis of the mid to  distal anterior and anteroseptal wall,  apical region). Left ventricular diastolic parameters were normal. The  average left ventricular global longitudinal strain is -15.0 %.   2. Right ventricular systolic function is normal. The right ventricular  size is normal.   3. The mitral valve is normal in structure. No evidence of mitral valve  regurgitation. No evidence of mitral stenosis.   4. The aortic valve was not well visualized. Aortic valve regurgitation  is not visualized. No aortic stenosis is present.   5. The  inferior vena cava is normal in size with greater than 50%  respiratory variability, suggesting right atrial pressure of 3 mmHg. __________  2D echo 04/25/2021: 1. Left ventricular ejection fraction, by estimation, is 40 to 45%. The  left ventricle has mildly decreased function. Anterior, anteroseptal and  apical hypokinesis. Left ventricular diastolic parameters are consistent  with Grade II diastolic dysfunction   (pseudonormalization). The average left ventricular global longitudinal  strain is -15.8 %. The global longitudinal strain is normal.   2. Right ventricular systolic function is normal. The right ventricular  size is normal. Tricuspid regurgitation signal is inadequate for assessing  PA pressure.   3. The mitral valve is normal in structure. Mild mitral valve  regurgitation. No evidence of mitral stenosis.   4. The aortic valve was not well visualized. Aortic valve regurgitation  is not visualized. No aortic stenosis is present.   5. There is borderline dilatation of the aortic root, measuring 37 mm.   EKG:  EKG is not ordered today.   Recent Labs: 04/28/2021: ALT 59; BUN 10; Creatinine, Ser 0.82; Potassium 4.3; Sodium 132  Recent Lipid Panel    Component Value Date/Time   CHOL 213 (H) 04/28/2021 1108   TRIG 256 (H) 04/28/2021 1108   HDL 57 04/28/2021 1108   CHOLHDL 3.7 04/28/2021 1108   CHOLHDL 3 12/14/2014 1129   VLDL 31.6 12/14/2014 1129   LDLCALC 112 (H) 04/28/2021 1108   LDLDIRECT 115 (H) 04/28/2021 1108    PHYSICAL EXAM:    VS:  BP 116/76 (BP Location: Left Arm, Patient Position: Sitting, Cuff Size: Large)    Pulse 72    Ht 5' 6.5" (1.689 m)    Wt 183 lb (83 kg)    SpO2 99%    BMI 29.09 kg/m   BMI: Body mass index is 29.09 kg/m.  Physical Exam Vitals reviewed.  Constitutional:      Appearance: He is well-developed.  HENT:     Head: Normocephalic and atraumatic.  Eyes:     General:        Right eye: No discharge.        Left eye: No discharge.   Neck:     Vascular: No JVD.  Cardiovascular:     Rate and Rhythm: Normal rate and regular rhythm.     Pulses:          Posterior tibial pulses are 2+ on the right side and 2+ on  the left side.     Heart sounds: Normal heart sounds, S1 normal and S2 normal. Heart sounds not distant. No midsystolic click and no opening snap. No murmur heard.   No friction rub.  Pulmonary:     Effort: Pulmonary effort is normal. No respiratory distress.     Breath sounds: Normal breath sounds. No decreased breath sounds, wheezing or rales.  Chest:     Chest wall: No tenderness.  Abdominal:     General: There is no distension.     Palpations: Abdomen is soft.     Tenderness: There is no abdominal tenderness.  Musculoskeletal:     Cervical back: Normal range of motion.     Right lower leg: No edema.     Left lower leg: No edema.  Skin:    General: Skin is warm and dry.     Nails: There is no clubbing.  Neurological:     Mental Status: He is alert and oriented to person, place, and time.  Psychiatric:        Speech: Speech normal.        Behavior: Behavior normal.        Thought Content: Thought content normal.        Judgment: Judgment normal.    Wt Readings from Last 3 Encounters:  06/16/21 183 lb (83 kg)  02/10/21 180 lb (81.6 kg)  08/24/19 179 lb 2 oz (81.3 kg)     ASSESSMENT & PLAN:   CAD involving the native coronary arteries without angina: He continues to do well without any symptoms concerning for angina.  Continue secondary prevention and aggressive risk factor modification including aspirin, atorvastatin, carvedilol, and lisinopril.  No indication for further ischemic testing at this time.  Bicuspid aortic valve: No evidence of significant stenosis on echo from 04/2021 with borderline dilatation of the aortic root noted at that time.  Follow-up echo in 04/2022 with low threshold to pursue cross-sectional imaging of the aorta.  HFrEF secondary to ICM: He appears euvolemic and well  compensated.  Most recent echo from 04/2021 demonstrated a persistent cardiomyopathy with an EF of 45% with stable wall motion abnormalities consistent with his prior known MI.  He will research Entresto and spironolactone.  Following this, he will get back in touch with us regarding which medication he would like to move forward with for further escalation of GDMT.  If he does move forward with Entresto, we will need to have a 36-hour washout of ACE inhibitor prior to initiating ARNI.  Would look to plan for a follow-up BMP 1 week after initiating ARNI or MRA.  [place] [address] [place], this could occur remotely with results faxed to us.  Continue carvedilol and lisinopril for now.  Not requiring a standing diuretic.  Continue to escalate GDMT as labs and vital signs allow.  Echo will be updated in 04/2022 as outlined above.  CHF education.  HLD: Most recent LDL of 115 with goal being less than 70.  He remains on atorvastatin 20 mg.  He is working on a heart healthy diet.  Update lipid panel at next visit.  Borderline dilatation of the aortic root: We will plan to trend at this with an echo in 04/2022.  If there is any significant change in aortic root measurement we will plan to pursue cross-sectional imaging for further evaluation of the aorta given his history of bicuspid aortic valve.  Optimal blood pressure control and precautions were discussed.    Disposition:  F/u with Dr. Kirke Corin or an APP in 12 months.   Medication Adjustments/Labs and Tests Ordered: Current medicines are reviewed at length with the patient today.  Concerns regarding medicines are outlined above. Medication changes, Labs and Tests ordered today are summarized above and listed in the Patient Instructions accessible in Encounters.   Signed, Eula Listen, PA-C 06/16/2021 4:54 PM     [facility] [address] [place], [place] [address] [phone]

## 2021-06-16 ENCOUNTER — Other Ambulatory Visit: Payer: Self-pay

## 2021-06-16 ENCOUNTER — Encounter: Payer: Self-pay | Admitting: Physician Assistant

## 2021-06-16 ENCOUNTER — Ambulatory Visit (INDEPENDENT_AMBULATORY_CARE_PROVIDER_SITE_OTHER): Payer: BC Managed Care – PPO | Admitting: Physician Assistant

## 2021-06-16 VITALS — BP 116/76 | HR 72 | Ht 66.5 in | Wt 183.0 lb

## 2021-06-16 DIAGNOSIS — I255 Ischemic cardiomyopathy: Secondary | ICD-10-CM | POA: Diagnosis not present

## 2021-06-16 DIAGNOSIS — I5020 Unspecified systolic (congestive) heart failure: Secondary | ICD-10-CM

## 2021-06-16 DIAGNOSIS — E785 Hyperlipidemia, unspecified: Secondary | ICD-10-CM

## 2021-06-16 DIAGNOSIS — Q231 Congenital insufficiency of aortic valve: Secondary | ICD-10-CM

## 2021-06-16 DIAGNOSIS — I2510 Atherosclerotic heart disease of native coronary artery without angina pectoris: Secondary | ICD-10-CM | POA: Diagnosis not present

## 2021-06-16 NOTE — Patient Instructions (Addendum)
Medication Instructions:  No changes at this time.  *If you need a refill on your cardiac medications before your next appointment, please call your pharmacy*   Lab Work: None  If you have labs (blood work) drawn today and your tests are completely normal, you will receive your results only by: MyChart Message (if you have MyChart) OR A paper copy in the mail If you have any lab test that is abnormal or we need to change your treatment, we will call you to review the results.   Testing/Procedures: Your physician has requested that you have an echocardiogram in December 2023.   Echocardiography is a painless test that uses sound waves to create images of your heart. It provides your doctor with information about the size and shape of your heart and how well your hearts chambers and valves are working. This procedure takes approximately one hour. There are no restrictions for this procedure.    Follow-Up: At [facility], you and your health needs are our priority.  As part of our continuing mission to provide you with exceptional heart care, we have created designated Provider Care Teams.  These Care Teams include your primary Cardiologist (physician) and Advanced Practice Providers (APPs -  Physician Assistants and Nurse Practitioners) who all work together to provide you with the care you need, when you need it.   Your next appointment:   1 year(s)  The format for your next appointment:   In Person  Provider:   Lorine Bears, MD or Eula Listen, PA-C     Mainstays of therapy for aneurysms include good blood pressure control, healthy lifestyle, and avoiding fluoroquinolone antibiotic medications (such as those in the "Cipro" class, ending in "floxacin") due to risk of damage to the aorta. This is a finding I would expect to be monitored periodically by their primary cardiologist. Since aneurysms can run in families, you should discuss your diagnosis with first degree relatives  as they may need to be screened for this. Regular mild-moderate physical exercise is OK but avoid heavy lifting/weight lifting over 30lbs, chopping wood, shoveling snow or digging heavy earth with a shovel. I typically refer patients to this flyer (https://medicine.http://www.martinez-gonzalez.info/.pdf) for more information, keeping in mind of some of this information also pertains to people who have had surgery for this.

## 2021-06-17 ENCOUNTER — Other Ambulatory Visit: Payer: Self-pay | Admitting: Cardiovascular Disease

## 2021-09-03 ENCOUNTER — Other Ambulatory Visit: Payer: Self-pay | Admitting: Cardiovascular Disease

## 2021-09-28 ENCOUNTER — Other Ambulatory Visit: Payer: Self-pay | Admitting: *Deleted

## 2021-09-28 MED ORDER — ATORVASTATIN CALCIUM 20 MG PO TABS
20.0000 mg | ORAL_TABLET | Freq: Every day | ORAL | 3 refills | Status: DC
Start: 2021-09-28 — End: 2022-06-29

## 2022-02-12 ENCOUNTER — Other Ambulatory Visit: Payer: Self-pay | Admitting: Physician Assistant

## 2022-02-18 ENCOUNTER — Other Ambulatory Visit: Payer: Self-pay | Admitting: Cardiovascular Disease

## 2022-05-24 ENCOUNTER — Other Ambulatory Visit: Payer: Self-pay | Admitting: Physician Assistant

## 2022-06-16 ENCOUNTER — Other Ambulatory Visit: Payer: Self-pay | Admitting: Physician Assistant

## 2022-06-18 NOTE — Telephone Encounter (Signed)
Please contact pt for future appointment. Pt overdue for 6 month f/u. Pt needing refills. 

## 2022-06-18 NOTE — Telephone Encounter (Signed)
LMOV to schedule  

## 2022-06-19 ENCOUNTER — Other Ambulatory Visit: Payer: Self-pay | Admitting: Physician Assistant

## 2022-06-20 NOTE — Telephone Encounter (Signed)
Good Morning,   Could you please schedule this patient a yearly follow up visit? The patient was last seen by [name] on 06/16/21. Thank you so much.

## 2022-06-27 ENCOUNTER — Telehealth: Payer: Self-pay | Admitting: Physician Assistant

## 2022-06-27 DIAGNOSIS — I255 Ischemic cardiomyopathy: Secondary | ICD-10-CM

## 2022-06-27 DIAGNOSIS — E785 Hyperlipidemia, unspecified: Secondary | ICD-10-CM

## 2022-06-27 DIAGNOSIS — Q2381 Bicuspid aortic valve: Secondary | ICD-10-CM

## 2022-06-27 DIAGNOSIS — I2510 Atherosclerotic heart disease of native coronary artery without angina pectoris: Secondary | ICD-10-CM

## 2022-06-27 DIAGNOSIS — I5020 Unspecified systolic (congestive) heart failure: Secondary | ICD-10-CM

## 2022-06-27 DIAGNOSIS — Q231 Congenital insufficiency of aortic valve: Secondary | ICD-10-CM

## 2022-06-27 NOTE — Telephone Encounter (Signed)
Patient would like to know if he needs to have labs done prior to his visit on 3/26.  If so he would like the orders sent to him as he lives [place] away he will have the labs done closer to home.

## 2022-06-28 NOTE — Telephone Encounter (Signed)
Left voicemail message that lab orders will be mailed to him and to let us know if he should have any further questions.

## 2022-06-28 NOTE — Telephone Encounter (Signed)
Please place orders and have these mailed to the patient for CMET, fasting lipid panel, and CBC.  [place] if we can arrange for him to have his follow-up echo on the day of his appointment to minimize his travel (if this fits with his schedule).

## 2022-07-07 ENCOUNTER — Other Ambulatory Visit: Payer: Self-pay | Admitting: Cardiovascular Disease

## 2022-08-10 DIAGNOSIS — E785 Hyperlipidemia, unspecified: Secondary | ICD-10-CM | POA: Diagnosis not present

## 2022-08-10 DIAGNOSIS — I255 Ischemic cardiomyopathy: Secondary | ICD-10-CM | POA: Diagnosis not present

## 2022-08-10 DIAGNOSIS — I5020 Unspecified systolic (congestive) heart failure: Secondary | ICD-10-CM | POA: Diagnosis not present

## 2022-08-10 DIAGNOSIS — Q231 Congenital insufficiency of aortic valve: Secondary | ICD-10-CM | POA: Diagnosis not present

## 2022-08-10 DIAGNOSIS — I2510 Atherosclerotic heart disease of native coronary artery without angina pectoris: Secondary | ICD-10-CM | POA: Diagnosis not present

## 2022-08-11 LAB — COMPREHENSIVE METABOLIC PANEL
ALT: 29 IU/L (ref 0–44)
AST: 22 IU/L (ref 0–40)
Albumin/Globulin Ratio: 2 (ref 1.2–2.2)
Albumin: 5 g/dL (ref 4.1–5.1)
Alkaline Phosphatase: 61 IU/L (ref 44–121)
BUN/Creatinine Ratio: 18 (ref 9–20)
BUN: 13 mg/dL (ref 6–20)
Bilirubin Total: 1.2 mg/dL (ref 0.0–1.2)
CO2: 23 mmol/L (ref 20–29)
Calcium: 9.8 mg/dL (ref 8.7–10.2)
Chloride: 98 mmol/L (ref 96–106)
Creatinine, Ser: 0.71 mg/dL — ABNORMAL LOW (ref 0.76–1.27)
Globulin, Total: 2.5 g/dL (ref 1.5–4.5)
Glucose: 98 mg/dL (ref 70–99)
Potassium: 4.3 mmol/L (ref 3.5–5.2)
Sodium: 137 mmol/L (ref 134–144)
Total Protein: 7.5 g/dL (ref 6.0–8.5)
eGFR: 120 mL/min/{1.73_m2} (ref >59)

## 2022-08-11 LAB — CBC
Hematocrit: 48 % (ref 37.5–51.0)
Hemoglobin: 16.4 g/dL (ref 13.0–17.7)
MCH: 30.7 pg (ref 26.6–33.0)
MCHC: 34.2 g/dL (ref 31.5–35.7)
MCV: 90 fL (ref 79–97)
Platelets: 285 10*3/uL (ref 150–450)
RBC: 5.35 x10E6/uL (ref 4.14–5.80)
RDW: 13.4 % (ref 11.6–15.4)
WBC: 6.9 10*3/uL (ref 3.4–10.8)

## 2022-08-11 LAB — LIPID PANEL
Chol/HDL Ratio: 3.1 ratio (ref 0.0–5.0)
Cholesterol, Total: 226 mg/dL — ABNORMAL HIGH (ref 100–199)
HDL: 73 mg/dL (ref >39)
LDL Chol Calc (NIH): 131 mg/dL — ABNORMAL HIGH (ref 0–99)
Triglycerides: 127 mg/dL (ref 0–149)
VLDL Cholesterol Cal: 22 mg/dL (ref 5–40)

## 2022-08-12 ENCOUNTER — Other Ambulatory Visit: Payer: Self-pay | Admitting: Cardiovascular Disease

## 2022-08-13 NOTE — Progress Notes (Unsigned)
Cardiology Office Note    Date:  08/14/2022   ID:  EZELLE DELASHMUTT, DOB 28-May-1985, MRN [id number]  PCP:  Pcp, No  Cardiologist:  Kathlyn Sacramento, MD  Electrophysiologist:  None   Chief Complaint: Follow up  History of Present Illness:   [name] is a 37 y.o. male with history of CAD with anterior STEMI in 05/2013 as detailed below, HFrEF secondary to ICM, bicuspid aortic valve and HLD who presents for follow-up of his CAD, cardiomyopathy, and bicuspid aortic valve.   He was admitted to the hospital in 05/2013 with an anterior STEMI.  Cardiac cath showed one-vessel CAD with an occluded mid LAD thought to be secondary to plaque rupture.  No other disease was noted.  He underwent successful aspiration thrombectomy and DES x2 to the mid LAD.  A second stent was placed due to suspected edge dissection versus spasm.  2D echo at that time showed an EF of 40 to 45%, akinesis of the mid distal anteroseptal and apical myocardium with grade 1 diastolic dysfunction and bicuspid aortic valve without significant regurgitation or stenosis.  UDS was positive for amphetamines.  He reported taking energy pills from a friend.  Follow-up echo from 05/2015 showed normalization of his LV systolic function with an EF of 55 to 60%, mid anteroseptal to apical septal hypokinesis, normal LV diastolic function, bicuspid aortic valve with no evidence of stenosis or regurgitation, aortic root 29 mm, ascending aorta 30 mm, normal RV systolic function and cavity size.  Follow-up echo 12/19/2018, showed low normal EF of 50 to 55%, normal LV diastolic function, mid anteroseptal to apical septal and apical hypokinesis, normal RV systolic function and cavity size, aortic valve poorly visualized, normal size and structure aortic root and ascending aorta.  He underwent repeat echo in 11/2019, given history of bicuspid aortic valve, which demonstrated an EF of 40 to 45%, hypokinesis of the mid to distal anterior and anteroseptal wall  and apical region, normal LV diastolic function parameters, normal RV systolic function and ventricular cavity size, no evidence of aortic stenosis, normal size and structure aortic root, and an estimated right atrial pressure of 3 mmHg.  He was seen in the office in 01/2021 and was doing well from a cardiac perspective.  He had relocated to the [facility].  He was transitioned from Lopressor to carvedilol.  Subsequent echo in 04/2021 demonstrated an EF of 40 to 45%, hypokinesis of the anterior, anteroseptal, and apical regions, grade 2 diastolic dysfunction, normal RV systolic function and ventricular cavity size, mild mitral regurgitation, poorly visualized aortic valve without evidence of stenosis, and borderline dilatation of the aortic root measuring 37 mm.  He was last seen in the office in 05/2021 and remained without symptoms of angina or cardiac decompensation.  We discussed escalation of GDMT with transition to [facility], with his preference to research medication.  He comes in doing very well from a cardiac perspective and is without symptoms of angina or cardiac decompensation.  He indicates this is the best he has felt in quite some time and feels better than he did last year.  No dizziness, presyncope, syncope.  No significant lower extremity swelling or orthopnea.  He remains very active and is working on consuming a [employer].  His weight is down 27 pounds when compared to his last visit.  This is intentional.  Adherent and tolerating cardiac medications.   Labs independently reviewed: 07/2022 - Hgb 16.4, PLT 285, BUN 13, SCr 0.71, potassium  4.3, albumin 5.0, AST/ALT normal, TC 226, TG 127, HDL 73, LDL 131 07/2015 - TSH normal  Past Medical History:  Diagnosis Date   Coronary artery disease    Hidradenitis    History of Doppler ultrasound    a. Carotid US 3/17: normal carotid arteries bilaterally   Hyperlipidemia    Ischemic cardiomyopathy    Ejection fraction of 40-45% due  to anterior myocardial infarction    Past Surgical History:  Procedure Laterality Date   ANKLE SURGERY Left    CARDIAC CATHETERIZATION     CORONARY ANGIOPLASTY     LEFT HEART CATHETERIZATION WITH CORONARY ANGIOGRAM N/A 06/14/2013   Procedure: LEFT HEART CATHETERIZATION WITH CORONARY ANGIOGRAM;  Surgeon: Wellington Hampshire, MD;  Location: [facility] CATH LAB;  Service: Cardiovascular;  Laterality: N/A;   PERCUTANEOUS CORONARY STENT INTERVENTION (PCI-S)  06/14/2013   Procedure: PERCUTANEOUS CORONARY STENT INTERVENTION (PCI-S);  Surgeon: Wellington Hampshire, MD;  Location: [facility] CATH LAB;  Service: Cardiovascular;;  mid LAD 2DES placed   PILONIDAL CYST EXCISION     WISDOM TOOTH EXTRACTION      Current Medications: Current Meds  Medication Sig   aspirin EC 81 MG EC tablet Take 1 tablet (81 mg total) by mouth daily.   atorvastatin (LIPITOR) 20 MG tablet Take 1 tablet by mouth daily at 6 PM.   carvedilol (COREG) 6.25 MG tablet Take 1 tablet by mouth twice daily   lisinopril (ZESTRIL) 5 MG tablet Take 1 tablet by mouth daily. Please keep scheduled appointment for further refills.   Multiple Vitamin (MULTI-VITAMINS) TABS Take 1 tablet by mouth daily.   nitroGLYCERIN (NITROSTAT) 0.4 MG SL tablet DISSOLVE 1 TABLET UNDER THE TONGUE EVERY 5 MINUTES AS NEEDED FOR CHEST PAIN   sildenafil (VIAGRA) 50 MG tablet Take 50 mg by mouth as needed.    Allergies:   Penicillins   Social History   Socioeconomic History   Marital status: Single    Spouse name: Not on file   Number of children: Not on file   Years of education: Not on file   Highest education level: Not on file  Occupational History   Not on file  Tobacco Use   Smoking status: Never   Smokeless tobacco: Never  Vaping Use   Vaping Use: Never used  Substance and Sexual Activity   Alcohol use: Yes    Alcohol/week: 0.0 standard drinks of alcohol    Comment: once a week   Drug use: No   Sexual activity: Yes  Other Topics Concern   Not on file   Social History Narrative   He lives alone.  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   Education:  B.S.    Social Determinants of Health   Financial Resource Strain: Not on file  Food Insecurity: Not on file  Transportation Needs: Not on file  Physical Activity: Not on file  Stress: Not on file  Social Connections: Not on file     Family History:  The patient's family history includes Coronary artery disease in his paternal grandmother; Diabetes in his paternal grandmother; Headache in his father; Healthy in his brother and mother.  ROS:   12-point review of systems is negative unless otherwise noted in the HPI.   EKGs/Labs/Other Studies Reviewed:    Studies reviewed were summarized above. The additional studies were reviewed today:  2D echo 11/2018: 1. The left ventricle has low normal systolic function, with an ejection  fraction of 50-55%. The cavity size was  normal. Left ventricular diastolic  parameters were normal.  Mid anteroseptal to apical septal, apical hypokinesis.   2. The right ventricle has normal systolic function. The cavity was  normal. There is no increase in right ventricular wall thickness. Unable  to estimate RVSP.  3. The aortic valve was not well visualized.Unable to exclude bicuspid  valve.   4. The aorta is normal in size and structure. Root is 3.3 cm, ascending  aorta 2.8 cm  __________   2D echo 11/2019: 1. Left ventricular ejection fraction, by estimation, is 40 to 45%. The  left ventricle has mildly decreased function. The left ventricle  demonstrates regional wall motion abnormalities (Hypokinesis of the mid to  distal anterior and anteroseptal wall,  apical region). Left ventricular diastolic parameters were normal. The  average left ventricular global longitudinal strain is -15.0 %.   2. Right ventricular systolic function is normal. The right ventricular  size is normal.   3. The mitral valve is normal in structure. No evidence of  mitral valve  regurgitation. No evidence of mitral stenosis.   4. The aortic valve was not well visualized. Aortic valve regurgitation  is not visualized. No aortic stenosis is present.   5. The inferior vena cava is normal in size with greater than 50%  respiratory variability, suggesting right atrial pressure of 3 mmHg. __________   2D echo 04/25/2021: 1. Left ventricular ejection fraction, by estimation, is 40 to 45%. The  left ventricle has mildly decreased function. Anterior, anteroseptal and  apical hypokinesis. Left ventricular diastolic parameters are consistent  with Grade II diastolic dysfunction   (pseudonormalization). The average left ventricular global longitudinal  strain is -15.8 %. The global longitudinal strain is normal.   2. Right ventricular systolic function is normal. The right ventricular  size is normal. Tricuspid regurgitation signal is inadequate for assessing  PA pressure.   3. The mitral valve is normal in structure. Mild mitral valve  regurgitation. No evidence of mitral stenosis.   4. The aortic valve was not well visualized. Aortic valve regurgitation  is not visualized. No aortic stenosis is present.   5. There is borderline dilatation of the aortic root, measuring 37 mm.   EKG:  EKG is ordered today.  The EKG ordered today demonstrates NSR, 67 bpm, no acute ST-T changes  Recent Labs: 08/10/2022: ALT 29; BUN 13; Creatinine, Ser 0.71; Hemoglobin 16.4; Platelets 285; Potassium 4.3; Sodium 137  Recent Lipid Panel    Component Value Date/Time   CHOL 226 (H) 08/10/2022 1636   TRIG 127 08/10/2022 1636   HDL 73 08/10/2022 1636   CHOLHDL 3.1 08/10/2022 1636   CHOLHDL 3 12/14/2014 1129   VLDL 31.6 12/14/2014 1129   LDLCALC 131 (H) 08/10/2022 1636   LDLDIRECT 115 (H) 04/28/2021 1108    PHYSICAL EXAM:    VS:  BP 112/76 (BP Location: Left Arm, Patient Position: Sitting, Cuff Size: Normal)   Pulse 67   Ht 5\' 6"  (1.676 m)   Wt 156 lb 9.6 oz (71 kg)    SpO2 99%   BMI 25.28 kg/m   BMI: Body mass index is 25.28 kg/m.  Physical Exam Vitals reviewed.  Constitutional:      Appearance: He is well-developed.  HENT:     Head: Normocephalic and atraumatic.  Eyes:     General:        Right eye: No discharge.        Left eye: No discharge.  Neck:     Vascular:  No JVD.  Cardiovascular:     Rate and Rhythm: Normal rate and regular rhythm.     Heart sounds: Normal heart sounds, S1 normal and S2 normal. Heart sounds not distant. No midsystolic click and no opening snap. No murmur heard.    No friction rub.  Pulmonary:     Effort: Pulmonary effort is normal. No respiratory distress.     Breath sounds: Normal breath sounds. No decreased breath sounds, wheezing or rales.  Chest:     Chest wall: No tenderness.  Abdominal:     General: There is no distension.  Musculoskeletal:     Cervical back: Normal range of motion.  Skin:    General: Skin is warm and dry.     Nails: There is no clubbing.  Neurological:     Mental Status: He is alert and oriented to person, place, and time.  Psychiatric:        Speech: Speech normal.        Behavior: Behavior normal.        Thought Content: Thought content normal.        Judgment: Judgment normal.     Wt Readings from Last 3 Encounters:  08/14/22 156 lb 9.6 oz (71 kg)  06/16/21 183 lb (83 kg)  02/10/21 180 lb (81.6 kg)     ASSESSMENT & PLAN:   CAD involving the native coronary arteries without angina: He continues to do very well and is without symptoms concerning for angina or cardiac decompensation.  Continue secondary prevention and aggressive risk factor modification including aspirin, atorvastatin, carvedilol, and lisinopril.  No indication for further ischemic testing at this time.  Bicuspid aortic valve with dilated aortic root: No evidence of significant aortic stenosis from echo in 04/2021 borderline dilatation of the aortic root noted at that time.  No murmur noted on exam today.  Will  schedule echo and CTA of the aorta.  Optimal blood pressure.  HFrEF secondary to ICM: He is euvolemic and well compensated.  Most recent echo from 04/2021 demonstrated persistent cardiomyopathy with an EF of 45%, stable wall motion abnormalities consistent with prior known MI.  Update echo.  If his cardiomyopathy persists, would recommend transitioning ACE inhibitor to [facility] following 36-hour washout along with further escalation of GDMT as indicated.  He remains on lisinopril and carvedilol.  Not requiring a standing loop diuretic.  HLD: Most recent LDL of 131 with goal being less than 70.  He is adherent to atorvastatin.  He will work on heart healthy diet.   Disposition: F/u with Dr. Fletcher Anon or an APP in 12 months given travel to and from appointment.   Medication Adjustments/Labs and Tests Ordered: Current medicines are reviewed at length with the patient today.  Concerns regarding medicines are outlined above. Medication changes, Labs and Tests ordered today are summarized above and listed in the Patient Instructions accessible in Encounters.   Signed, Christell Faith, PA-C 08/14/2022 3:31 PM     [place] [address] [place], [place] [address] [phone]

## 2022-08-14 ENCOUNTER — Ambulatory Visit: Payer: BC Managed Care – PPO | Attending: Physician Assistant | Admitting: Physician Assistant

## 2022-08-14 ENCOUNTER — Encounter: Payer: Self-pay | Admitting: Physician Assistant

## 2022-08-14 VITALS — BP 112/76 | HR 67 | Ht 66.0 in | Wt 156.6 lb

## 2022-08-14 DIAGNOSIS — I255 Ischemic cardiomyopathy: Secondary | ICD-10-CM

## 2022-08-14 DIAGNOSIS — Q2381 Bicuspid aortic valve: Secondary | ICD-10-CM

## 2022-08-14 DIAGNOSIS — I77810 Thoracic aortic ectasia: Secondary | ICD-10-CM | POA: Diagnosis not present

## 2022-08-14 DIAGNOSIS — Q231 Congenital insufficiency of aortic valve: Secondary | ICD-10-CM | POA: Diagnosis not present

## 2022-08-14 DIAGNOSIS — E785 Hyperlipidemia, unspecified: Secondary | ICD-10-CM

## 2022-08-14 DIAGNOSIS — I5020 Unspecified systolic (congestive) heart failure: Secondary | ICD-10-CM | POA: Diagnosis not present

## 2022-08-14 DIAGNOSIS — I2510 Atherosclerotic heart disease of native coronary artery without angina pectoris: Secondary | ICD-10-CM

## 2022-08-14 NOTE — Patient Instructions (Addendum)
Medication Instructions:   Your physician recommends that you continue on your current medications as directed. Please refer to the Current Medication list given to you today.  *If you need a refill on your cardiac medications before your next appointment, please call your pharmacy*   Lab Work:  NONE  If you have labs (blood work) drawn today and your tests are completely normal, you will receive your results only by: [place] (if you have MyChart) OR A paper copy in the mail If you have any lab test that is abnormal or we need to change your treatment, we will call you to review the results.   Testing/Procedures:   *Please contact our office to have your echocardiogram scheduled [phone] once you verify your availability.   Your physician has requested that you have an echocardiogram. Echocardiography is a painless test that uses sound waves to create images of your heart. It provides your doctor with information about the size and shape of your heart and how well your heart's chambers and valves are working. This procedure takes approximately one hour. There are no restrictions for this procedure. Please do NOT wear cologne, perfume, aftershave, or lotions (deodorant is allowed). Please arrive 15 minutes prior to your appointment time.  CTA- Aorta/ Aortic root dilation   You will receive a call by Colletta Maryland in Scheduling to schedule CTA of the aorta.  Follow-Up: At [facility], you and your health needs are our priority.  As part of our continuing mission to provide you with exceptional heart care, we have created designated Provider Care Teams.  These Care Teams include your primary Cardiologist (physician) and Advanced Practice Providers (APPs -  Physician Assistants and Nurse Practitioners) who all work together to provide you with the care you need, when you need it.  We recommend signing up for the patient portal called "MyChart".  Sign up information is  provided on this After Visit Summary.  MyChart is used to connect with patients for Virtual Visits (Telemedicine).  Patients are able to view lab/test results, encounter notes, upcoming appointments, etc.  Non-urgent messages can be sent to your provider as well.   To learn more about what you can do with MyChart, go to [email or web address].    Your next appointment:   12 month(s)  Provider:   You may see Kathlyn Sacramento, MD or one of the following Advanced Practice Providers on your designated Care Team:   Murray Hodgkins, NP Christell Faith, PA-C Cadence Kathlen Mody, PA-C Gerrie Nordmann, NP

## 2022-08-28 ENCOUNTER — Ambulatory Visit
Admission: RE | Admit: 2022-08-28 | Discharge: 2022-08-28 | Disposition: A | Discharge status: Home / Self Care | Payer: BC Managed Care – PPO | Source: Ambulatory Visit | Attending: Physician Assistant | Admitting: Physician Assistant

## 2022-08-28 ENCOUNTER — Telehealth: Payer: Self-pay | Admitting: *Deleted

## 2022-08-28 ENCOUNTER — Ambulatory Visit: Payer: BC Managed Care – PPO | Attending: Physician Assistant

## 2022-08-28 DIAGNOSIS — I77810 Thoracic aortic ectasia: Secondary | ICD-10-CM | POA: Diagnosis not present

## 2022-08-28 DIAGNOSIS — Q231 Congenital insufficiency of aortic valve: Secondary | ICD-10-CM | POA: Diagnosis not present

## 2022-08-28 DIAGNOSIS — R911 Solitary pulmonary nodule: Secondary | ICD-10-CM | POA: Diagnosis not present

## 2022-08-28 LAB — ECHOCARDIOGRAM COMPLETE
AR max vel: 3.05 cm2
AV Area VTI: 2.81 cm2
AV Area mean vel: 2.68 cm2
AV Mean grad: 3.5 mmHg
AV Peak grad: 6.5 mmHg
Ao pk vel: 1.28 m/s
Area-P 1/2: 4.89 cm2
Calc EF: 44.9 %
S' Lateral: 3.7 cm
Single Plane A2C EF: 45.4 %
Single Plane A4C EF: 44.5 %

## 2022-08-28 MED ORDER — IOHEXOL 350 MG/ML SOLN
75.0000 mL | Freq: Once | INTRAVENOUS | Status: AC | PRN
  Administered 2022-08-28: 75 mL via INTRAVENOUS

## 2022-08-28 NOTE — Telephone Encounter (Signed)
-----   Message from Sondra Barges, PA-C sent at 08/28/2022  7:36 AM EDT ----- Regarding: RE: Pending Authorization Pam,   Please reach out to the patient this morning to have the echo rescheduled. He lives 3 hours away, so may already be on his way to the office.  ----- Message ----- From: Ursula Alert Sent: 08/28/2022   7:06 AM EDT To: Scarlette Ar; Sondra Barges, PA-C; # Subject: FW: Pending Authorization                      Good Morning,   Could this patient be rescheduled please?  Thank you,   Ihor Gully   ----- Message ----- From: Donald Siva Sent: 08/27/2022  10:01 AM EDT To: Kendrick Fries, CMA; Ursula Alert; # Subject: RE: Pending Authorization                      It would not.  It is for follow-up of a bicuspid aortic valve with dilated aortic root.  Echo is medically indicated and necessary.  However, the patient lives 3 hours away.  Therefore, please contact him as soon as possible to notify the echo will need to be rescheduled.  Ryan ----- Message ----- From: Ursula Alert Sent: 08/27/2022   9:53 AM EDT To: Sondra Barges, PA-C; Kendrick Fries, CMA; # Subject: Pending Authorization                          Good Morning,   Hope all is well, this patient is scheduled for an echocardiogram tomorrow 08/28/22 at 4pm. The patient has dual insurance carriers, the authorization for the exam was approved through [place], however [employer]'s request is currently under review. Would the patient's health be at risk if the exam is pushed out a few days until we receive a determination from [employer]?   Thanks,   Ihor Gully

## 2022-08-28 NOTE — Telephone Encounter (Signed)
Spoke with patient in regards to his echocardiogram scheduled for today. Advised that we are waiting on some insurance approval and that we need to reschedule. He discussed that the only insurance that should be used is [employer]. Checked with Boyd Kerbs via secure chat and she reports it was authorized and patient may keep appointment for later today. Patient has been updated and confirmed.

## 2022-08-30 ENCOUNTER — Telehealth: Payer: Self-pay | Admitting: *Deleted

## 2022-08-30 NOTE — Telephone Encounter (Signed)
-----   Message from Sondra Barges, [place] sent at 08/28/2022  4:11 PM EDT ----- Please inform the patient his CTA of the aortic a slightly dilated aorta that did not meet criteria for aneurysm as well as a single less than 4 mm left pulmonary nodule that does not require specific follow-up.  Overall, reassuring study with just a slightly dilated aorta without evidence of aneurysm.  This is a good baseline.  Await echo.

## 2022-08-30 NOTE — Telephone Encounter (Signed)
Left voicemail message to call back for review of 2 results.

## 2022-08-30 NOTE — Telephone Encounter (Signed)
-----   Message from Sondra Barges, PA-C sent at 08/29/2022 10:47 AM EDT ----- Echo showed a pump function of 40-45%, sluggish movement of the mid to distal anterior wall of the heart, normal relaxation of the heart, mildly leaky mitral valve, known bicuspid aortic valve with no evidence of narrowing and normal size of the aortic root.   When compared to prior echo, his pump function and sluggish wall movement are unchanged/stable dating back to 2021. These findings are consistent with his known prior heart attack.   Recommendations: -If he is agreeable, would stop lisinopril and start Entresto 24/26 mg bid, 48 hours after last dose of lisinopril (needs to be at least 36 hours) -If he is ok with the above change in medication, would recommend a BMP 1 week after starting Sherryll Burger -He lives along the [place], so would need to mail a lab order to have drawn locally for him and have the results faxed to our office

## 2022-09-06 NOTE — Telephone Encounter (Signed)
Left voicemail message to call back for review of results.  

## 2022-09-07 ENCOUNTER — Other Ambulatory Visit: Payer: Self-pay | Admitting: *Deleted

## 2022-09-07 ENCOUNTER — Encounter: Payer: Self-pay | Admitting: *Deleted

## 2022-09-07 DIAGNOSIS — I2510 Atherosclerotic heart disease of native coronary artery without angina pectoris: Secondary | ICD-10-CM

## 2022-09-07 DIAGNOSIS — I255 Ischemic cardiomyopathy: Secondary | ICD-10-CM

## 2022-09-07 DIAGNOSIS — I5020 Unspecified systolic (congestive) heart failure: Secondary | ICD-10-CM

## 2022-09-07 MED ORDER — ENTRESTO 24-26 MG PO TABS: 1.0000 | ORAL_TABLET | Freq: Two times a day (BID) | ORAL | 11 refills | Status: DC

## 2022-09-07 NOTE — Telephone Encounter (Signed)
Attempt #3 so sending letter to patient with results.

## 2022-09-07 NOTE — Telephone Encounter (Signed)
Spoke with patient and reviewed all results and recommendations. Advised that medications have been updated. Reviewed that these results are on My chart and I mailed letter as well. He verbalized understanding with no further questions at this time.

## 2022-09-07 NOTE — Telephone Encounter (Signed)
Patient is returning call.  °

## 2022-09-10 ENCOUNTER — Telehealth: Payer: Self-pay | Admitting: Cardiovascular Disease

## 2022-09-10 DIAGNOSIS — I2510 Atherosclerotic heart disease of native coronary artery without angina pectoris: Secondary | ICD-10-CM

## 2022-09-10 DIAGNOSIS — I255 Ischemic cardiomyopathy: Secondary | ICD-10-CM

## 2022-09-10 DIAGNOSIS — I77810 Thoracic aortic ectasia: Secondary | ICD-10-CM

## 2022-09-10 DIAGNOSIS — E785 Hyperlipidemia, unspecified: Secondary | ICD-10-CM

## 2022-09-10 DIAGNOSIS — Q231 Congenital insufficiency of aortic valve: Secondary | ICD-10-CM

## 2022-09-10 DIAGNOSIS — I5020 Unspecified systolic (congestive) heart failure: Secondary | ICD-10-CM

## 2022-09-10 MED ORDER — ENTRESTO 24-26 MG PO TABS: 1.0000 | ORAL_TABLET | Freq: Two times a day (BID) | ORAL | 11 refills | Status: DC

## 2022-09-10 NOTE — Telephone Encounter (Signed)
Please advise if ok to refill Entresto 24-26.

## 2022-09-10 NOTE — Telephone Encounter (Signed)
*  STAT* If patient is at the pharmacy, call can be transferred to refill team.   1. Which medications need to be refilled? (please list name of each medication and dose if known) sacubitril-valsartan (ENTRESTO) 24-26 MG   2. Which pharmacy/location (including street and city if local pharmacy) is medication to be sent to? [facility] [address], [place] - [address]   3. Do they need a 30 day or 90 day supply? 90

## 2022-09-10 NOTE — Telephone Encounter (Signed)
Patient calling in to say that he decided to go ahead and take sacubitril-valsartan (ENTRESTO) 24-26 MG . He was calling to make our office aware and to see when he needed to do his blood work. Please advise

## 2022-09-10 NOTE — Telephone Encounter (Signed)
  Called and spoke with the patient. The patient would like to start the entresto. He has been advised of the recommendations below concerning lisinopril. The patient did state that he would like the entresto sent to the pharmacy in [place]. Prescriptions sent and lab order will be sent as well.     Recommendations: -If he is agreeable, would stop lisinopril and start Entresto 24/26 mg bid, 48 hours after last dose of lisinopril (needs to be at least 36 hours) -If he is ok with the above change in medication, would recommend a BMP 1 week after starting Sherryll Burger -He lives along the [place], so would need to mail a lab order to have drawn locally for him and have the results faxed to our office

## 2022-09-11 ENCOUNTER — Other Ambulatory Visit: Payer: Self-pay | Admitting: *Deleted

## 2022-09-11 DIAGNOSIS — I255 Ischemic cardiomyopathy: Secondary | ICD-10-CM

## 2022-09-11 DIAGNOSIS — E785 Hyperlipidemia, unspecified: Secondary | ICD-10-CM

## 2022-09-11 DIAGNOSIS — I77810 Thoracic aortic ectasia: Secondary | ICD-10-CM

## 2022-09-11 DIAGNOSIS — Q231 Congenital insufficiency of aortic valve: Secondary | ICD-10-CM

## 2022-09-11 DIAGNOSIS — I5020 Unspecified systolic (congestive) heart failure: Secondary | ICD-10-CM

## 2022-09-11 DIAGNOSIS — I2510 Atherosclerotic heart disease of native coronary artery without angina pectoris: Secondary | ICD-10-CM

## 2022-09-16 ENCOUNTER — Other Ambulatory Visit: Payer: Self-pay | Admitting: Cardiovascular Disease

## 2022-09-18 NOTE — Telephone Encounter (Signed)
Patient calling back to say that he haven't received mail order for the labs. Or if we can go ahead to release to [facility]. Please advise

## 2022-09-18 NOTE — Telephone Encounter (Signed)
Spoke with patient and reviewed that previous lab order was mailed to him. He reports not receiving that order and requested that we please resend. Advised that I would get that mailed out today and to let us know if he has not received it by next week. He verbalized understanding with no further questions at this time.

## 2022-09-18 NOTE — Addendum Note (Signed)
Addended by: Bryna Colander on: 09/18/2022 01:28 PM   Modules accepted: Orders

## 2022-09-27 DIAGNOSIS — I2510 Atherosclerotic heart disease of native coronary artery without angina pectoris: Secondary | ICD-10-CM | POA: Diagnosis not present

## 2022-09-27 DIAGNOSIS — I5020 Unspecified systolic (congestive) heart failure: Secondary | ICD-10-CM | POA: Diagnosis not present

## 2022-09-27 DIAGNOSIS — Q231 Congenital insufficiency of aortic valve: Secondary | ICD-10-CM | POA: Diagnosis not present

## 2022-09-27 DIAGNOSIS — I255 Ischemic cardiomyopathy: Secondary | ICD-10-CM | POA: Diagnosis not present

## 2022-09-28 LAB — BASIC METABOLIC PANEL
BUN/Creatinine Ratio: 20 (ref 9–20)
BUN: 14 mg/dL (ref 6–20)
CO2: 24 mmol/L (ref 20–29)
Calcium: 9.5 mg/dL (ref 8.7–10.2)
Chloride: 98 mmol/L (ref 96–106)
Creatinine, Ser: 0.71 mg/dL — ABNORMAL LOW (ref 0.76–1.27)
Glucose: 96 mg/dL (ref 70–99)
Potassium: 4.3 mmol/L (ref 3.5–5.2)
Sodium: 137 mmol/L (ref 134–144)
eGFR: 120 mL/min/{1.73_m2} (ref >59)

## 2022-10-23 ENCOUNTER — Other Ambulatory Visit: Payer: Self-pay | Admitting: Physician Assistant

## 2022-11-15 ENCOUNTER — Other Ambulatory Visit: Payer: Self-pay | Admitting: Physician Assistant

## 2023-02-07 ENCOUNTER — Telehealth: Payer: Self-pay | Admitting: Emergency Medicine

## 2023-02-07 NOTE — Telephone Encounter (Signed)
Case reviewed with primary cardiologist.  Given updated information with significant exertional capacity without cardiac limitation, he is okay to participate with scuba diving.

## 2023-02-08 NOTE — Telephone Encounter (Signed)
Pt's med clearance scan sent to pt through MyChart attachment

## 2023-02-11 ENCOUNTER — Telehealth: Payer: Self-pay | Admitting: *Deleted

## 2023-02-11 NOTE — Telephone Encounter (Signed)
error 

## 2023-05-13 ENCOUNTER — Other Ambulatory Visit: Payer: Self-pay | Admitting: Cardiovascular Disease

## 2023-05-21 ENCOUNTER — Other Ambulatory Visit: Payer: Self-pay | Admitting: Physician Assistant

## 2023-08-13 ENCOUNTER — Other Ambulatory Visit: Payer: Self-pay

## 2023-08-13 MED ORDER — ENTRESTO 24-26 MG PO TABS: 1.0000 | ORAL_TABLET | Freq: Two times a day (BID) | ORAL | 0 refills | Status: DC

## 2023-12-02 ENCOUNTER — Encounter: Payer: Self-pay | Admitting: Physician Assistant

## 2024-01-09 ENCOUNTER — Other Ambulatory Visit (HOSPITAL_COMMUNITY): Payer: Self-pay

## 2024-01-09 ENCOUNTER — Other Ambulatory Visit: Payer: Self-pay | Admitting: Cardiovascular Disease

## 2024-01-09 ENCOUNTER — Other Ambulatory Visit: Payer: Self-pay | Admitting: Physician Assistant

## 2024-01-09 ENCOUNTER — Telehealth: Payer: Self-pay | Admitting: Cardiovascular Disease

## 2024-01-09 MED ORDER — CARVEDILOL 6.25 MG PO TABS
6.2500 mg | ORAL_TABLET | Freq: Two times a day (BID) | ORAL | 2 refills | Status: DC
  Filled 2024-01-09: qty 60, 30d supply, fill #0

## 2024-01-09 MED ORDER — SACUBITRIL-VALSARTAN 24-26 MG PO TABS: 1.0000 | ORAL_TABLET | Freq: Two times a day (BID) | ORAL | 2 refills | Status: DC

## 2024-01-09 MED ORDER — ATORVASTATIN CALCIUM 20 MG PO TABS
20.0000 mg | ORAL_TABLET | Freq: Every day | ORAL | 2 refills | Status: DC
  Filled 2024-01-09: qty 30, 30d supply, fill #0

## 2024-01-09 MED ORDER — SACUBITRIL-VALSARTAN 24-26 MG PO TABS
1.0000 | ORAL_TABLET | Freq: Two times a day (BID) | ORAL | 2 refills | Status: DC
  Filled 2024-01-09: qty 60, 30d supply, fill #0

## 2024-01-09 NOTE — Telephone Encounter (Signed)
 RX sent in

## 2024-01-09 NOTE — Telephone Encounter (Signed)
*  STAT* If patient is at the pharmacy, call can be transferred to refill team.   1. Which medications need to be refilled? (please list name of each medication and dose if known) sacubitril -valsartan  (ENTRESTO ) 24-26 MG     4. Which pharmacy/location (including street and city if local pharmacy) is medication to be sent to? [facility] - [place], [place] - [address]     5. Do they need a 30 day or 90 day supply? 90    Scheduled 10/9

## 2024-01-09 NOTE — Telephone Encounter (Signed)
 Pt called in stating he spoke to someone a few weeks ago that told him that he needed to have labs done. I dont see any note or orders in, can this be confirmed if he needs labs or not.

## 2024-01-09 NOTE — Telephone Encounter (Signed)
 Left voicemail message to call back

## 2024-01-09 NOTE — Telephone Encounter (Signed)
Patient was returning your call. Please advise 

## 2024-01-09 NOTE — Telephone Encounter (Signed)
 Spoke with patient and advised there are no labs ordered. He said the primary reason for calling was for some refills until his next appointment in October. Advised that he must keep that appointment in order to get any further refills. He verbalized understanding with no further questions.

## 2024-01-10 ENCOUNTER — Other Ambulatory Visit: Payer: Self-pay | Admitting: Physician Assistant

## 2024-01-10 ENCOUNTER — Other Ambulatory Visit (HOSPITAL_COMMUNITY): Payer: Self-pay

## 2024-01-21 ENCOUNTER — Other Ambulatory Visit (HOSPITAL_COMMUNITY): Payer: Self-pay

## 2024-02-27 ENCOUNTER — Ambulatory Visit: Attending: Cardiovascular Disease | Admitting: Cardiovascular Disease

## 2024-02-27 ENCOUNTER — Encounter: Payer: Self-pay | Admitting: Cardiovascular Disease

## 2024-02-27 VITALS — BP 110/78 | HR 63 | Ht 66.0 in | Wt 170.5 lb

## 2024-02-27 DIAGNOSIS — E785 Hyperlipidemia, unspecified: Secondary | ICD-10-CM

## 2024-02-27 DIAGNOSIS — Q2381 Bicuspid aortic valve: Secondary | ICD-10-CM

## 2024-02-27 DIAGNOSIS — I255 Ischemic cardiomyopathy: Secondary | ICD-10-CM

## 2024-02-27 DIAGNOSIS — I2510 Atherosclerotic heart disease of native coronary artery without angina pectoris: Secondary | ICD-10-CM

## 2024-02-27 MED ORDER — CARVEDILOL 6.25 MG PO TABS: 6.2500 mg | ORAL_TABLET | Freq: Two times a day (BID) | ORAL | 3 refills | Status: AC

## 2024-02-27 MED ORDER — ATORVASTATIN CALCIUM 20 MG PO TABS: 20.0000 mg | ORAL_TABLET | Freq: Every day | ORAL | 3 refills | Status: AC

## 2024-02-27 MED ORDER — SACUBITRIL-VALSARTAN 24-26 MG PO TABS: 1.0000 | ORAL_TABLET | Freq: Two times a day (BID) | ORAL | 3 refills | Status: AC

## 2024-02-27 NOTE — Progress Notes (Signed)
 Cardiology Office Note   Date:  02/27/2024   ID:  [name], DOB [birth year], MRN [id number]  PCP:  Patient, No Pcp Per  Cardiologist:   Deatrice Cage, MD   Chief Complaint  Patient presents with   Follow-up    OD 12 month f/u no complaints today. Meds reviewed verbally with pt.      History of Present Illness: [name] is a 38 y.o. male who presents for a follow up visit regarding coronary artery disease and bicuspid aortic valve.  He had anterior STEMI in 05/2013 . Cardiac catheterization revealed severe one vessel CAD with an occluded mid LAD thought to be plaque rupture. No other disease noted. He underwent Successful aspiration thrombectomy and drug-eluting stent placement x2 to the mid LAD. A second stent was placed due to suspected edge dissection versus spasm.  2D echo revealed EF of 40-45%, akinesis of the mid-distalanteroseptal and apical myocardium and grade one diastolic dysfuntion.  UDS was positive for amphetamines . He reported taking energy pills from a friend.   Most recent echocardiogram in April 2024 showed an EF of 40 to 45% with anterior wall hypokinesis, mild mitral regurgitation and bicuspid aortic valve without significant stenosis or regurgitation. CTA of the chest in April 2024 showed that his aortic root was in the upper limit of normal at 3.8 cm.  He has been doing extremely well with no chest pain, shortness of breath or palpitations.  He exercises on a regular basis. He lives in [place] and has his own business of video production.  Unfortunately, he has been under significant stress lately due to illness of his father who fell and had intracranial bleed.   Past Medical History:  Diagnosis Date   Coronary artery disease    Hidradenitis    History of Doppler ultrasound    a. Carotid US  3/17: normal carotid arteries bilaterally   Hyperlipidemia    Ischemic cardiomyopathy    Ejection fraction of 40-45% due to anterior myocardial  infarction    Past Surgical History:  Procedure Laterality Date   ANKLE SURGERY Left    CARDIAC CATHETERIZATION     CORONARY ANGIOPLASTY     LEFT HEART CATHETERIZATION WITH CORONARY ANGIOGRAM N/A 06/14/2013   Procedure: LEFT HEART CATHETERIZATION WITH CORONARY ANGIOGRAM;  Surgeon: Deatrice DELENA Cage, MD;  Location: [facility] CATH LAB;  Service: Cardiovascular;  Laterality: N/A;   PERCUTANEOUS CORONARY STENT INTERVENTION (PCI-S)  06/14/2013   Procedure: PERCUTANEOUS CORONARY STENT INTERVENTION (PCI-S);  Surgeon: Deatrice DELENA Cage, MD;  Location: [facility] CATH LAB;  Service: Cardiovascular;;  mid LAD 2DES placed   PILONIDAL CYST EXCISION     WISDOM TOOTH EXTRACTION       Current Outpatient Medications  Medication Sig Dispense Refill   aspirin  EC 81 MG EC tablet Take 1 tablet (81 mg total) by mouth daily.     atorvastatin  (LIPITOR) 20 MG tablet Take 1 tablet (20 mg total) by mouth daily. PLEASE CALL [phone] TO SCHEDULE AN APPOINTMENT PRIOR TO NEXT REFILL REQUEST 30 tablet 2   carvedilol  (COREG ) 6.25 MG tablet Take 1 tablet (6.25 mg total) by mouth 2 (two) times daily. PLEASE CALL [phone] TO SCHEDULE AN APPOINTMENT PRIOR TO NEXT REFILL REQUEST 60 tablet 2   Multiple Vitamin (MULTI-VITAMINS) TABS Take 1 tablet by mouth daily.     nitroGLYCERIN  (NITROSTAT ) 0.4 MG SL tablet DISSOLVE 1 TABLET UNDER THE TONGUE EVERY 5 MINUTES AS NEEDED FOR CHEST PAIN 25 tablet 1   sacubitril -valsartan  (  ENTRESTO ) 24-26 MG Take 1 tablet by mouth 2 (two) times daily. PLEASE CALL [phone] TO SCHEDULE AN APPOINTMENT PRIOR TO NEXT REFILL REQUEST. THANK YOU. 60 tablet 2   sildenafil (VIAGRA) 50 MG tablet Take 50 mg by mouth as needed.     No current facility-administered medications for this visit.    Allergies:   Penicillins    Social History:  The patient  reports that he has never smoked. He has never used smokeless tobacco. He reports current alcohol use. He reports that he does not use drugs.   Family History:   The patient's family history includes Coronary artery disease in his paternal grandmother; Diabetes in his paternal grandmother; Headache in his father; Healthy in his brother and mother.    ROS:  Please see the history of present illness.   Otherwise, review of systems are positive for none.   All other systems are reviewed and negative.    PHYSICAL EXAM: VS:  BP 110/78 (BP Location: Left Arm, Patient Position: Sitting, Cuff Size: Normal)   Pulse 63   Ht 5' 6 (1.676 m)   Wt 170 lb 8 oz (77.3 kg)   SpO2 99%   BMI 27.52 kg/m  , BMI Body mass index is 27.52 kg/m. GEN: Well nourished, well developed, in no acute distress  HEENT: normal  Neck: no JVD, carotid bruits, or masses Cardiac: RRR; no murmurs, rubs, or gallops,no edema  Respiratory:  clear to auscultation bilaterally, normal work of breathing GI: soft, nontender, nondistended, + BS MS: no deformity or atrophy  Skin: warm and dry, no rash Neuro:  Strength and sensation are intact Psych: euthymic mood, full affect   EKG:  EKG is ordered today. The ekg ordered today demonstrates : Sinus rhythm with sinus arrhythmia with short PR When compared with ECG of 15-Jun-2013 06:41, Vent. rate has decreased BY  43 BPM Criteria for Septal infarct are no longer Present ST less elevated in Inferior leads ST no longer elevated in Anterior leads QT has shortened    Recent Labs: No results found for requested labs within last 365 days.    Lipid Panel    Component Value Date/Time   CHOL 226 (H) 08/10/2022 1636   TRIG 127 08/10/2022 1636   HDL 73 08/10/2022 1636   CHOLHDL 3.1 08/10/2022 1636   CHOLHDL 3 12/14/2014 1129   VLDL 31.6 12/14/2014 1129   LDLCALC 131 (H) 08/10/2022 1636   LDLDIRECT 115 (H) 04/28/2021 1108      Wt Readings from Last 3 Encounters:  02/27/24 170 lb 8 oz (77.3 kg)  08/14/22 156 lb 9.6 oz (71 kg)  06/16/21 183 lb (83 kg)          No data to display            ASSESSMENT AND PLAN:  1.   Coronary artery disease involving native coronary arteries without angina: He is doing very well. Continue medical therapy.  2. Bicuspid aortic valve, no associated stenosis or regurgitation.  No cardiac murmurs by physical exam.  No need for an echocardiogram this year.  3. Hyperlipidemia: I requested lipid and liver profile and refilled atorvastatin  20 mg daily.  We might have to increase the dose based on the results given that his most recent LDL was 131.  4.  Chronic systolic heart failure due to post MI cardiomyopathy with an EF of 40 to 45%.  No evidence of volume overload.  He is a [place]  heart association class I.  Continue carvedilol  and Entresto .  Both medications were refilled and will check labs today.  5.  Ectatic aortic root with no evidence of aneurysm: CTA showed that this was 3.8 cm in diameter which is at the upper limit of normal.  No need for repeat imaging this year and can likely monitor with echocardiogram.    Disposition:   FU with me in 1 year  Signed,  Deatrice Cage, MD  02/27/2024 8:41 AM    [facility]

## 2024-02-27 NOTE — Patient Instructions (Signed)
 Medication Instructions:  No changes *If you need a refill on your cardiac medications before your next appointment, please call your pharmacy*  Lab Work: Your provider would like for you to have the following labs today: CMET, CBC and Lipid  If you have labs (blood work) drawn today and your tests are completely normal, you will receive your results only by: MyChart Message (if you have MyChart) OR A paper copy in the mail If you have any lab test that is abnormal or we need to change your treatment, we will call you to review the results.  Testing/Procedures: None ordered  Follow-Up: At [facility], you and your health needs are our priority.  As part of our continuing mission to provide you with exceptional heart care, our providers are all part of one team.  This team includes your primary Cardiologist (physician) and Advanced Practice Providers or APPs (Physician Assistants and Nurse Practitioners) who all work together to provide you with the care you need, when you need it.  Your next appointment:   12 month(s)  Provider:   You may see Deatrice Cage, MD or one of the following Advanced Practice Providers on your designated Care Team:   Lonni Meager, NP Lesley Maffucci, PA-C Bernardino Bring, PA-C Cadence [place], PA-C Tylene Lunch, NP Barnie Hila, NP    We recommend signing up for the patient portal called MyChart.  Sign up information is provided on this After Visit Summary.  MyChart is used to connect with patients for Virtual Visits (Telemedicine).  Patients are able to view lab/test results, encounter notes, upcoming appointments, etc.  Non-urgent messages can be sent to your provider as well.   To learn more about what you can do with MyChart, go to [email or web address].

## 2024-02-28 ENCOUNTER — Ambulatory Visit: Payer: Self-pay | Admitting: Cardiovascular Disease

## 2024-02-28 LAB — COMPREHENSIVE METABOLIC PANEL WITH GFR
ALT: 39 IU/L (ref 0–44)
AST: 21 IU/L (ref 0–40)
Albumin: 4.9 g/dL (ref 4.1–5.1)
Alkaline Phosphatase: 45 IU/L — ABNORMAL LOW (ref 47–123)
BUN/Creatinine Ratio: 17 (ref 9–20)
BUN: 15 mg/dL (ref 6–24)
Bilirubin Total: 1 mg/dL (ref 0.0–1.2)
CO2: 24 mmol/L (ref 20–29)
Calcium: 9.6 mg/dL (ref 8.7–10.2)
Chloride: 100 mmol/L (ref 96–106)
Creatinine, Ser: 0.9 mg/dL (ref 0.76–1.27)
Globulin, Total: 2.2 g/dL (ref 1.5–4.5)
Glucose: 119 mg/dL — ABNORMAL HIGH (ref 70–99)
Potassium: 4.9 mmol/L (ref 3.5–5.2)
Sodium: 139 mmol/L (ref 134–144)
Total Protein: 7.1 g/dL (ref 6.0–8.5)
eGFR: 111 mL/min/1.73 (ref >59)

## 2024-02-28 LAB — LIPID PANEL
Chol/HDL Ratio: 2.7 ratio (ref 0.0–5.0)
Cholesterol, Total: 225 mg/dL — ABNORMAL HIGH (ref 100–199)
HDL: 83 mg/dL (ref >39)
LDL Chol Calc (NIH): 127 mg/dL — ABNORMAL HIGH (ref 0–99)
Triglycerides: 89 mg/dL (ref 0–149)
VLDL Cholesterol Cal: 15 mg/dL (ref 5–40)

## 2024-02-28 LAB — CBC WITH DIFFERENTIAL/PLATELET
Basophils Absolute: 0.1 x10E3/uL (ref 0.0–0.2)
Basos: 1 %
EOS (ABSOLUTE): 0.1 x10E3/uL (ref 0.0–0.4)
Eos: 2 %
Hematocrit: 48.8 % (ref 37.5–51.0)
Hemoglobin: 16 g/dL (ref 13.0–17.7)
Immature Grans (Abs): 0 x10E3/uL (ref 0.0–0.1)
Immature Granulocytes: 0 %
Lymphocytes Absolute: 2.3 x10E3/uL (ref 0.7–3.1)
Lymphs: 37 %
MCH: 30.1 pg (ref 26.6–33.0)
MCHC: 32.8 g/dL (ref 31.5–35.7)
MCV: 92 fL (ref 79–97)
Monocytes Absolute: 0.7 x10E3/uL (ref 0.1–0.9)
Monocytes: 11 %
Neutrophils Absolute: 3.1 x10E3/uL (ref 1.4–7.0)
Neutrophils: 48 %
Platelets: 270 x10E3/uL (ref 150–450)
RBC: 5.31 x10E6/uL (ref 4.14–5.80)
RDW: 13.7 % (ref 11.6–15.4)
WBC: 6.3 x10E3/uL (ref 3.4–10.8)
